# Patient Record
Sex: Male | Born: 1975 | Race: White | Hispanic: No | Marital: Married | Smoking: Former smoker
Health system: Southern US, Community
[De-identification: ages and names within clinical notes are randomized; demographics above are authoritative.]

## PROBLEM LIST (undated history)

## (undated) DIAGNOSIS — I10 Essential (primary) hypertension: Secondary | ICD-10-CM

## (undated) DIAGNOSIS — E785 Hyperlipidemia, unspecified: Secondary | ICD-10-CM

## (undated) DIAGNOSIS — R0609 Other forms of dyspnea: Secondary | ICD-10-CM

## (undated) DIAGNOSIS — R0600 Dyspnea, unspecified: Secondary | ICD-10-CM

## (undated) DIAGNOSIS — Z6836 Body mass index (BMI) 36.0-36.9, adult: Secondary | ICD-10-CM

## (undated) DIAGNOSIS — T783XXA Angioneurotic edema, initial encounter: Secondary | ICD-10-CM

## (undated) HISTORY — DX: Other forms of dyspnea: R06.09

## (undated) HISTORY — DX: Essential (primary) hypertension: I10

## (undated) HISTORY — DX: Angioneurotic edema, initial encounter: T78.3XXA

## (undated) HISTORY — DX: Dyspnea, unspecified: R06.00

## (undated) HISTORY — DX: Body mass index (BMI) 36.0-36.9, adult: Z68.36

## (undated) HISTORY — DX: Hyperlipidemia, unspecified: E78.5

---

## 2015-09-21 ENCOUNTER — Ambulatory Visit (INDEPENDENT_AMBULATORY_CARE_PROVIDER_SITE_OTHER): Payer: Managed Care, Other (non HMO) | Admitting: Allergy and Immunology

## 2015-09-21 ENCOUNTER — Encounter: Payer: Self-pay | Admitting: Allergy and Immunology

## 2015-09-21 VITALS — BP 150/90 | HR 80 | Temp 98.1°F | Resp 18 | Ht 67.13 in | Wt 235.9 lb

## 2015-09-21 DIAGNOSIS — T783XXA Angioneurotic edema, initial encounter: Secondary | ICD-10-CM | POA: Insufficient documentation

## 2015-09-21 DIAGNOSIS — T7840XA Allergy, unspecified, initial encounter: Secondary | ICD-10-CM

## 2015-09-21 HISTORY — DX: Angioneurotic edema, initial encounter: T78.3XXA

## 2015-09-21 MED ORDER — EPINEPHRINE 0.3 MG/0.3ML IJ SOAJ: 0.3000 mg | Freq: Once | INTRAMUSCULAR | Status: AC

## 2015-09-21 NOTE — Progress Notes (Signed)
[facility]    NEW PATIENT NOTE  Referring Provider: No ref. provider found Primary Provider: Wilmer Floor., MD    Subjective:   Patient ID: Matthew Navarro is a 39 y.o. male with a chief complaint of Angioedema  who presents to the clinic with the following problems:  HPI Comments:  Matthew Navarro presents this clinic in 09/21/2015 in evaluation of swelling. It sounds as though over the course of the past 7 years he has had multiple episodes of swelling involving either his face or hands. His face swelling is associated with lip swelling and periorbital swelling but does not sound as though it is associated with any tongue swelling. Usually these episodes last several hours after being treated with Benadryl. He did have to go to the urticaria care center about one month ago for significant facial swelling requiring the administration of steroids. He thinks that his swelling episodes may be related to consumption of apples and peaches. Apparently carries gives him throat itching. He does have a history of seasonal allergic rhinitis for which she'll take some over-the-counter antihistamines during the spring and fall.   History reviewed. No pertinent past medical history.  History reviewed. No pertinent past surgical history.  No outpatient encounter prescriptions on file as of 09/21/2015.   No facility-administered encounter medications on file as of 09/21/2015.    No orders of the defined types were placed in this encounter.    No Known Allergies  Review of Systems  Constitutional: Negative.   HENT: Positive for sneezing (Seasonal allergic rhinitis treated with over-the-counter antihistamine during spring and fall).   Eyes: Negative.   Respiratory: Negative.   Cardiovascular: Negative.   Gastrointestinal: Negative.   Genitourinary: Negative.   Musculoskeletal: Negative.   Skin: Negative.   Neurological: Negative.    Hematological: Negative.     Family History  Problem Relation Age of Onset  . Heart attack Father   . Diabetes Paternal Uncle   . Parkinson's disease Paternal Grandmother   . Breast cancer Paternal Grandmother   . CVA Paternal Grandfather     Social History   Social History  . Marital Status: Single    Spouse Name: N/A  . Number of Children: N/A  . Years of Education: N/A   Occupational History  . Not on file.   Social History Main Topics  . Smoking status: Former Smoker -- 0.08 packs/day for 8 years    Types: Cigarettes    Quit date: 11/01/2011  . Smokeless tobacco: Not on file  . Alcohol Use: 6.0 oz/week    10 Standard drinks or equivalent per week     Comment: wine or beer  . Drug Use: No  . Sexual Activity: Not on file   Other Topics Concern  . Not on file   Social History Narrative  . No narrative on file    Environmental and Social history  Lives in a house with a dry environment, dogs located inside the household, carpeting in the bedroom, sleeping of step mattress without any plastic for the bed or pillows, and no smokers located inside the household. He works as a Engineer, site company   Objective:   Filed Vitals:   09/21/15 1411  BP: 150/90  Pulse: 80  Temp: 98.1 F (36.7 C)  Resp: 18        Physical Exam  Constitutional: He appears well-developed and well-nourished. No distress.  HENT:  Head: Normocephalic and atraumatic. Head is without right periorbital erythema and without left periorbital erythema.  Right Ear: Tympanic membrane, external ear and ear canal normal. No drainage or tenderness. No foreign bodies. Tympanic membrane is not injected, not scarred, not perforated, not erythematous, not retracted and not bulging. No middle ear effusion.  Left Ear: Tympanic membrane, external ear and ear canal normal. No drainage or tenderness. No foreign bodies. Tympanic membrane is not injected, not scarred,  not perforated, not erythematous, not retracted and not bulging.  No middle ear effusion.  Nose: Nose normal. No mucosal edema, rhinorrhea, nose lacerations or sinus tenderness.  No foreign bodies.  Mouth/Throat: Oropharynx is clear and moist. No oropharyngeal exudate, posterior oropharyngeal edema, posterior oropharyngeal erythema or tonsillar abscesses.  Eyes: Lids are normal. Right eye exhibits no chemosis, no discharge and no exudate. No foreign body present in the right eye. Left eye exhibits no chemosis, no discharge and no exudate. No foreign body present in the left eye. Right conjunctiva is not injected. Left conjunctiva is not injected.  Neck: Neck supple. No tracheal tenderness present. No tracheal deviation and no edema present. No thyroid mass and no thyromegaly present.  Cardiovascular: Normal rate, regular rhythm, S1 normal and S2 normal.  Exam reveals no gallop.   No murmur heard. Pulmonary/Chest: No accessory muscle usage or stridor. No respiratory distress. He has no wheezes. He has no rhonchi. He has no rales.  Abdominal: Soft. He exhibits no distension and no mass. There is no tenderness. There is no rebound and no guarding.  Lymphadenopathy:       Head (right side): No tonsillar adenopathy present.       Head (left side): No tonsillar adenopathy present.    He has no cervical adenopathy.  Neurological: He is alert.  Skin: No rash noted. He is not diaphoretic.  Psychiatric: He has a normal mood and affect. His behavior is normal.    Diagnostics:  Allergy skin tests were performed.  He demonstrated hypersensitivity against almonds and hazelnut on a screening food panel   Assessment and Plan:    1. Angioedema, initial encounter   2. Allergic reaction, initial encounter      1. Avoidance measures - almonds / hazelnut  2. Epi-Pen, Benadryl, MD / ER for allergic reaction.  3. Blood - cbc with diff, fruit IC panel, alpha-gal panel, cmp, sed, U/A  4. Further  evaluation or treatment? - yes, if recurrent  5. Get a flu vaccine.  I will have the obtain the blood tests noted above in investigation of his immunologic hyperreactivity and we'll make a decision about what further evaluation and treatment he may require pending his response to therapy noted in his plan. I did give him an EpiPen to cover the possibility of a severe allergic reaction that may threaten one of his major organ systems. He will keep in contact with me noting his response to the therapy mentioned above.    Laurette SchimkeEric Kozlow, MD [facility]

## 2015-09-21 NOTE — Patient Instructions (Signed)
  1. Avoidance measures  2. Epi-Pen, Benadryl, MD / ER for allergic reaction.  3. Blood - cbc with diff, fruit IC panel, alpha-gal panel, cmp, sed, U/A  4. Further evaluation or treatment? - yes, if recurrent  5. Get a flu vaccine.

## 2015-09-26 LAB — URINALYSIS
BILIRUBIN UA: NEGATIVE
GLUCOSE, UA: NEGATIVE
KETONES UA: NEGATIVE
Leukocytes, UA: NEGATIVE
NITRITE UA: NEGATIVE
RBC, UA: NEGATIVE
Specific Gravity, UA: 1.027 (ref 1.005–1.030)
UUROB: 0.2 mg/dL (ref 0.2–1.0)
pH, UA: 6.5 (ref 5.0–7.5)

## 2015-09-26 LAB — COMPREHENSIVE METABOLIC PANEL
ALBUMIN: 4.9 g/dL (ref 3.5–5.5)
ALT: 30 IU/L (ref 0–44)
AST: 20 IU/L (ref 0–40)
Albumin/Globulin Ratio: 2 (ref 1.1–2.5)
Alkaline Phosphatase: 64 IU/L (ref 39–117)
BILIRUBIN TOTAL: 0.6 mg/dL (ref 0.0–1.2)
BUN/Creatinine Ratio: 17 (ref 8–19)
BUN: 15 mg/dL (ref 6–20)
CALCIUM: 9.7 mg/dL (ref 8.7–10.2)
CHLORIDE: 97 mmol/L (ref 97–106)
CO2: 28 mmol/L (ref 18–29)
CREATININE: 0.88 mg/dL (ref 0.76–1.27)
GFR, EST AFRICAN AMERICAN: 126 mL/min/{1.73_m2} (ref >59)
GFR, EST NON AFRICAN AMERICAN: 109 mL/min/{1.73_m2} (ref >59)
GLUCOSE: 106 mg/dL — AB (ref 65–99)
Globulin, Total: 2.4 g/dL (ref 1.5–4.5)
Potassium: 4.3 mmol/L (ref 3.5–5.2)
Sodium: 140 mmol/L (ref 136–144)
TOTAL PROTEIN: 7.3 g/dL (ref 6.0–8.5)

## 2015-09-26 LAB — ALLERGEN PROFILE, FOOD-FRUIT
ALLERGEN APPLE, IGE: 0.61 kU/L — AB
Allergen Banana IgE: 0.3 kU/L — AB
Allergen, Peach f95: 1.01 kU/L — AB
F094-IGE PEAR: 0.32 kU/L — AB
F259-IGE GRAPE: 0.16 kU/L — AB

## 2015-09-26 LAB — CBC WITH DIFFERENTIAL/PLATELET
BASOS: 1 %
Basophils Absolute: 0 10*3/uL (ref 0.0–0.2)
EOS (ABSOLUTE): 0.1 10*3/uL (ref 0.0–0.4)
Eos: 1 %
HEMOGLOBIN: 16.6 g/dL (ref 12.6–17.7)
Hematocrit: 48.5 % (ref 37.5–51.0)
IMMATURE GRANS (ABS): 0 10*3/uL (ref 0.0–0.1)
Immature Granulocytes: 0 %
LYMPHS: 29 %
Lymphocytes Absolute: 2.4 10*3/uL (ref 0.7–3.1)
MCH: 30.2 pg (ref 26.6–33.0)
MCHC: 34.2 g/dL (ref 31.5–35.7)
MCV: 88 fL (ref 79–97)
MONOCYTES: 9 %
Monocytes Absolute: 0.8 10*3/uL (ref 0.1–0.9)
NEUTROS ABS: 4.9 10*3/uL (ref 1.4–7.0)
Neutrophils: 60 %
Platelets: 181 10*3/uL (ref 150–379)
RBC: 5.5 x10E6/uL (ref 4.14–5.80)
RDW: 13.5 % (ref 12.3–15.4)
WBC: 8.2 10*3/uL (ref 3.4–10.8)

## 2015-09-26 LAB — ALPHA-GAL PANEL
Class Interpretation: 0
Class Interpretation: 0
Class Interpretation: 0
Lamb/Mutton (Ovis spp) IgE: <0.1 kU/L (ref <0.35)
Pork (Sus spp) IgE: <0.1 kU/L (ref <0.35)

## 2015-09-26 LAB — SEDIMENTATION RATE: Sed Rate: 2 mm/hr (ref 0–15)

## 2016-01-05 ENCOUNTER — Telehealth: Payer: Self-pay | Admitting: Allergy

## 2016-01-05 NOTE — Telephone Encounter (Signed)
Testing went to ded - explained that to pt - he will pay $50/mo

## 2016-01-05 NOTE — Telephone Encounter (Signed)
PATIENT CALLED AND NEEDSTO DISCUSS BILL REQUARDING TEST AS WHAT HIS INSURANCE PAID.MRN # [id number]. PHONE #  [phone].

## 2016-06-15 DIAGNOSIS — H3530 Unspecified macular degeneration: Secondary | ICD-10-CM | POA: Diagnosis not present

## 2016-10-07 DIAGNOSIS — I10 Essential (primary) hypertension: Secondary | ICD-10-CM | POA: Diagnosis not present

## 2016-10-07 DIAGNOSIS — Z6836 Body mass index (BMI) 36.0-36.9, adult: Secondary | ICD-10-CM | POA: Diagnosis not present

## 2016-10-07 DIAGNOSIS — Z79899 Other long term (current) drug therapy: Secondary | ICD-10-CM | POA: Diagnosis not present

## 2016-10-07 DIAGNOSIS — E785 Hyperlipidemia, unspecified: Secondary | ICD-10-CM | POA: Diagnosis not present

## 2016-10-27 DIAGNOSIS — B360 Pityriasis versicolor: Secondary | ICD-10-CM | POA: Diagnosis not present

## 2016-12-16 DIAGNOSIS — H3530 Unspecified macular degeneration: Secondary | ICD-10-CM | POA: Diagnosis not present

## 2017-01-06 DIAGNOSIS — I10 Essential (primary) hypertension: Secondary | ICD-10-CM | POA: Diagnosis not present

## 2017-01-06 DIAGNOSIS — Z6836 Body mass index (BMI) 36.0-36.9, adult: Secondary | ICD-10-CM | POA: Diagnosis not present

## 2017-01-06 DIAGNOSIS — E785 Hyperlipidemia, unspecified: Secondary | ICD-10-CM | POA: Diagnosis not present

## 2017-01-06 DIAGNOSIS — Z79899 Other long term (current) drug therapy: Secondary | ICD-10-CM | POA: Diagnosis not present

## 2017-02-09 DIAGNOSIS — Z6837 Body mass index (BMI) 37.0-37.9, adult: Secondary | ICD-10-CM | POA: Diagnosis not present

## 2017-02-09 DIAGNOSIS — E669 Obesity, unspecified: Secondary | ICD-10-CM | POA: Diagnosis not present

## 2017-02-09 DIAGNOSIS — S0500XA Injury of conjunctiva and corneal abrasion without foreign body, unspecified eye, initial encounter: Secondary | ICD-10-CM | POA: Diagnosis not present

## 2017-04-13 DIAGNOSIS — E785 Hyperlipidemia, unspecified: Secondary | ICD-10-CM | POA: Diagnosis not present

## 2017-04-13 DIAGNOSIS — I10 Essential (primary) hypertension: Secondary | ICD-10-CM | POA: Diagnosis not present

## 2017-04-13 DIAGNOSIS — Z79899 Other long term (current) drug therapy: Secondary | ICD-10-CM | POA: Diagnosis not present

## 2017-04-13 DIAGNOSIS — Z1389 Encounter for screening for other disorder: Secondary | ICD-10-CM | POA: Diagnosis not present

## 2017-07-13 DIAGNOSIS — Z79899 Other long term (current) drug therapy: Secondary | ICD-10-CM | POA: Diagnosis not present

## 2017-07-13 DIAGNOSIS — E785 Hyperlipidemia, unspecified: Secondary | ICD-10-CM | POA: Diagnosis not present

## 2017-07-23 DIAGNOSIS — L0390 Cellulitis, unspecified: Secondary | ICD-10-CM | POA: Diagnosis not present

## 2017-10-12 DIAGNOSIS — Z6837 Body mass index (BMI) 37.0-37.9, adult: Secondary | ICD-10-CM | POA: Diagnosis not present

## 2017-10-12 DIAGNOSIS — Z0000 Encounter for general adult medical examination without abnormal findings: Secondary | ICD-10-CM | POA: Diagnosis not present

## 2017-10-12 DIAGNOSIS — E669 Obesity, unspecified: Secondary | ICD-10-CM | POA: Diagnosis not present

## 2017-10-12 DIAGNOSIS — I10 Essential (primary) hypertension: Secondary | ICD-10-CM | POA: Diagnosis not present

## 2018-01-11 DIAGNOSIS — I10 Essential (primary) hypertension: Secondary | ICD-10-CM | POA: Diagnosis not present

## 2018-01-11 DIAGNOSIS — Z79899 Other long term (current) drug therapy: Secondary | ICD-10-CM | POA: Diagnosis not present

## 2018-01-11 DIAGNOSIS — Z6837 Body mass index (BMI) 37.0-37.9, adult: Secondary | ICD-10-CM | POA: Diagnosis not present

## 2018-01-11 DIAGNOSIS — E785 Hyperlipidemia, unspecified: Secondary | ICD-10-CM | POA: Diagnosis not present

## 2018-01-30 DIAGNOSIS — H3530 Unspecified macular degeneration: Secondary | ICD-10-CM | POA: Diagnosis not present

## 2018-07-18 DIAGNOSIS — I10 Essential (primary) hypertension: Secondary | ICD-10-CM | POA: Diagnosis not present

## 2018-07-18 DIAGNOSIS — E785 Hyperlipidemia, unspecified: Secondary | ICD-10-CM | POA: Diagnosis not present

## 2018-07-18 DIAGNOSIS — Z79899 Other long term (current) drug therapy: Secondary | ICD-10-CM | POA: Diagnosis not present

## 2018-07-18 DIAGNOSIS — Z1331 Encounter for screening for depression: Secondary | ICD-10-CM | POA: Diagnosis not present

## 2018-07-18 DIAGNOSIS — Z6836 Body mass index (BMI) 36.0-36.9, adult: Secondary | ICD-10-CM | POA: Diagnosis not present

## 2018-09-13 DIAGNOSIS — Z6838 Body mass index (BMI) 38.0-38.9, adult: Secondary | ICD-10-CM | POA: Diagnosis not present

## 2018-09-13 DIAGNOSIS — J90 Pleural effusion, not elsewhere classified: Secondary | ICD-10-CM | POA: Diagnosis not present

## 2018-09-13 DIAGNOSIS — M79602 Pain in left arm: Secondary | ICD-10-CM | POA: Diagnosis not present

## 2018-11-01 DIAGNOSIS — E785 Hyperlipidemia, unspecified: Secondary | ICD-10-CM | POA: Diagnosis not present

## 2018-11-01 DIAGNOSIS — Z0000 Encounter for general adult medical examination without abnormal findings: Secondary | ICD-10-CM | POA: Diagnosis not present

## 2018-11-01 DIAGNOSIS — Z6837 Body mass index (BMI) 37.0-37.9, adult: Secondary | ICD-10-CM | POA: Diagnosis not present

## 2018-11-01 DIAGNOSIS — I10 Essential (primary) hypertension: Secondary | ICD-10-CM | POA: Diagnosis not present

## 2018-12-11 DIAGNOSIS — B360 Pityriasis versicolor: Secondary | ICD-10-CM | POA: Diagnosis not present

## 2019-05-02 DIAGNOSIS — Z79899 Other long term (current) drug therapy: Secondary | ICD-10-CM | POA: Diagnosis not present

## 2019-05-02 DIAGNOSIS — Z6837 Body mass index (BMI) 37.0-37.9, adult: Secondary | ICD-10-CM | POA: Diagnosis not present

## 2019-05-02 DIAGNOSIS — E785 Hyperlipidemia, unspecified: Secondary | ICD-10-CM | POA: Diagnosis not present

## 2019-05-02 DIAGNOSIS — I10 Essential (primary) hypertension: Secondary | ICD-10-CM | POA: Diagnosis not present

## 2019-11-13 DIAGNOSIS — I10 Essential (primary) hypertension: Secondary | ICD-10-CM | POA: Diagnosis not present

## 2019-11-13 DIAGNOSIS — E785 Hyperlipidemia, unspecified: Secondary | ICD-10-CM | POA: Diagnosis not present

## 2019-11-13 DIAGNOSIS — Z6838 Body mass index (BMI) 38.0-38.9, adult: Secondary | ICD-10-CM | POA: Diagnosis not present

## 2019-11-13 DIAGNOSIS — Z0000 Encounter for general adult medical examination without abnormal findings: Secondary | ICD-10-CM | POA: Diagnosis not present

## 2019-11-13 DIAGNOSIS — Z13220 Encounter for screening for lipoid disorders: Secondary | ICD-10-CM | POA: Diagnosis not present

## 2020-05-12 DIAGNOSIS — E785 Hyperlipidemia, unspecified: Secondary | ICD-10-CM | POA: Diagnosis not present

## 2020-05-12 DIAGNOSIS — M778 Other enthesopathies, not elsewhere classified: Secondary | ICD-10-CM | POA: Diagnosis not present

## 2020-05-12 DIAGNOSIS — Z79899 Other long term (current) drug therapy: Secondary | ICD-10-CM | POA: Diagnosis not present

## 2020-05-12 DIAGNOSIS — I10 Essential (primary) hypertension: Secondary | ICD-10-CM | POA: Diagnosis not present

## 2020-08-19 DIAGNOSIS — T783XXA Angioneurotic edema, initial encounter: Secondary | ICD-10-CM | POA: Diagnosis not present

## 2020-09-18 DIAGNOSIS — M25471 Effusion, right ankle: Secondary | ICD-10-CM | POA: Diagnosis not present

## 2020-09-18 DIAGNOSIS — Z6836 Body mass index (BMI) 36.0-36.9, adult: Secondary | ICD-10-CM | POA: Diagnosis not present

## 2020-09-18 DIAGNOSIS — R053 Chronic cough: Secondary | ICD-10-CM | POA: Diagnosis not present

## 2020-09-18 DIAGNOSIS — M25472 Effusion, left ankle: Secondary | ICD-10-CM | POA: Diagnosis not present

## 2020-10-16 DIAGNOSIS — R609 Edema, unspecified: Secondary | ICD-10-CM | POA: Diagnosis not present

## 2020-10-16 DIAGNOSIS — E785 Hyperlipidemia, unspecified: Secondary | ICD-10-CM | POA: Diagnosis not present

## 2020-10-16 DIAGNOSIS — I10 Essential (primary) hypertension: Secondary | ICD-10-CM | POA: Diagnosis not present

## 2020-10-16 DIAGNOSIS — Z79899 Other long term (current) drug therapy: Secondary | ICD-10-CM | POA: Diagnosis not present

## 2020-11-24 DIAGNOSIS — Z0000 Encounter for general adult medical examination without abnormal findings: Secondary | ICD-10-CM | POA: Diagnosis not present

## 2020-11-24 DIAGNOSIS — Z6838 Body mass index (BMI) 38.0-38.9, adult: Secondary | ICD-10-CM | POA: Diagnosis not present

## 2020-11-24 DIAGNOSIS — Z1331 Encounter for screening for depression: Secondary | ICD-10-CM | POA: Diagnosis not present

## 2020-11-27 ENCOUNTER — Encounter (INDEPENDENT_AMBULATORY_CARE_PROVIDER_SITE_OTHER): Payer: Self-pay

## 2020-11-27 ENCOUNTER — Other Ambulatory Visit: Payer: Self-pay

## 2020-11-27 ENCOUNTER — Ambulatory Visit: Payer: BC Managed Care – PPO | Admitting: Cardiology

## 2020-11-27 VITALS — BP 156/102 | HR 73 | Ht 70.0 in | Wt 257.0 lb

## 2020-11-27 DIAGNOSIS — E7800 Pure hypercholesterolemia, unspecified: Secondary | ICD-10-CM | POA: Diagnosis not present

## 2020-11-27 DIAGNOSIS — R0600 Dyspnea, unspecified: Secondary | ICD-10-CM | POA: Diagnosis not present

## 2020-11-27 DIAGNOSIS — I10 Essential (primary) hypertension: Secondary | ICD-10-CM

## 2020-11-27 DIAGNOSIS — R0609 Other forms of dyspnea: Secondary | ICD-10-CM

## 2020-11-27 DIAGNOSIS — R072 Precordial pain: Secondary | ICD-10-CM | POA: Diagnosis not present

## 2020-11-27 MED ORDER — METOPROLOL TARTRATE 100 MG PO TABS: 100.0000 mg | ORAL_TABLET | Freq: Once | ORAL | 0 refills | Status: DC

## 2020-11-27 NOTE — Progress Notes (Signed)
Cardiology Office Note:    Date:  11/27/2020   ID:  Matthew Navarro, DOB 1976/03/06, MRN [id number]  PCP:  Janine Limbo, PA-C  [place] Cardiologist:  No primary care provider on file.  [facility] Electrophysiologist:  None   Referring MD: Janine Limbo, PA-C    History of Present Illness:    Matthew Navarro is a 45 y.o. male with a hx of HTN, HLD, and angioedema who was referred by Quentin Angst, PA-C for further management of dyspnea on exertion.   Patient states that back in September, he was walking in [place] on many inclines and he felt very short of breath with some wheezing and chest pressure at the time. He stopped and rested and his symptoms abated. Had a few other minor episodes while working out, but nothing severe until a couple of weeks ago when he was out clearing the ice and he felt like he couldn't take a full deep breath. Again, he had some chest pressure at that time. He sat down and symptoms resolved. No known history of asthma or exercise induced asthma. No known personal history of CAD but has a strong family history as detailed below. Symptoms only occurred with exertion and do not occur with rest.   Prior smoker x 10 years. Quit several years ago (used to smoke 1-2packs per week)  Family history: Paternal GF: CVA, Paternal GM: CAD, Dad with MI (age 16; smoker and drinker).   Past Medical History:  Diagnosis Date  . Angioedema 09/21/2015  . BMI 36.0-36.9,adult   . DOE (dyspnea on exertion)    after a fall  . HTN (hypertension)   . Hyperlipidemia     History reviewed. No pertinent surgical history.  Current Medications: Current Meds  Medication Sig  . atorvastatin (LIPITOR) 20 MG tablet Take 20 mg by mouth daily.  Marland Kitchen diltiazem (CARDIZEM CD) 180 MG 24 hr capsule Take 180 mg by mouth daily.  Marland Kitchen EPINEPHrine 0.3 mg/0.3 mL IJ SOAJ injection Inject 0.3 mLs (0.3 mg total) into the muscle once.  . hydrochlorothiazide (HYDRODIURIL) 12.5 MG tablet Take  12.5 mg by mouth daily.  . metoprolol tartrate (LOPRESSOR) 100 MG tablet Take 1 tablet (100 mg total) by mouth once for 1 dose. Take 2 hours prior to your Coronary CT.  . Multiple Vitamin (MULTIVITAMIN) capsule Take 1 capsule by mouth daily.     Allergies:   Amlodipine and Amlodipine-olmesartan   Social History   Socioeconomic History  . Marital status: Single    Spouse name: Not on file  . Number of children: Not on file  . Years of education: Not on file  . Highest education level: Not on file  Occupational History  . Not on file  Tobacco Use  . Smoking status: Former Smoker    Packs/day: 0.08    Years: 8.00    Pack years: 0.64    Types: Cigarettes    Quit date: 11/01/2011    Years since quitting: 9.0  . Smokeless tobacco: Never Used  Substance and Sexual Activity  . Alcohol use: Yes    Alcohol/week: 10.0 standard drinks    Types: 10 Standard drinks or equivalent per week    Comment: wine or beer  . Drug use: No  . Sexual activity: Not on file  Other Topics Concern  . Not on file  Social History Narrative  . Not on file   Social Determinants of Health   Financial Resource Strain: Not on file  Food Insecurity: Not  on file  Transportation Needs: Not on file  Physical Activity: Not on file  Stress: Not on file  Social Connections: Not on file     Family History: The patient's family history includes Breast cancer in his paternal grandmother; CVA in his paternal grandfather; Diabetes in his paternal uncle; Heart attack (age of onset: 58) in his father; Parkinson's disease in his paternal grandmother.  ROS:   Please see the history of present illness.    Review of Systems  Constitutional: Negative for chills and fever.  HENT: Negative for nosebleeds.   Eyes: Negative for blurred vision.  Respiratory: Positive for shortness of breath.   Cardiovascular: Positive for chest pain. Negative for palpitations, orthopnea, claudication, leg swelling and PND.   Gastrointestinal: Negative for nausea and vomiting.  Genitourinary: Negative for hematuria.  Musculoskeletal: Negative for falls.  Neurological: Negative for dizziness and loss of consciousness.  Endo/Heme/Allergies: Negative for polydipsia.  Psychiatric/Behavioral: Negative for substance abuse.    EKGs/Labs/Other Studies Reviewed:    The following studies were reviewed today: No cardiac studies  EKG:  EKG is  ordered today.  The ekg ordered today demonstrates NSR with HR 73  Recent Labs: No results found for requested labs within last 8760 hours.  Recent Lipid Panel No results found for: CHOL, TRIG, HDL, CHOLHDL, VLDL, LDLCALC, LDLDIRECT   Physical Exam:    VS:  BP (!) 156/102   Pulse 73   Ht _0  (1.778 m)   Wt 257 lb (116.6 kg)   SpO2 96%   BMI 36.88 kg/m     Wt Readings from Last 3 Encounters:  11/27/20 257 lb (116.6 kg)  09/21/15 235 lb 14.3 oz (107 kg)     GEN:  Well nourished, well developed in no acute distress HEENT: Normal NECK: No JVD; No carotid bruits CARDIAC: RRR, no murmurs, rubs, gallops RESPIRATORY:  Clear to auscultation without rales, wheezing or rhonchi  ABDOMEN: Soft, non-tender, non-distended MUSCULOSKELETAL:  No edema; No deformity  SKIN: Warm and dry NEUROLOGIC:  Alert and oriented x 3 PSYCHIATRIC:  Normal affect   ASSESSMENT:    1. Dyspnea on exertion   2. Precordial pain   3. Primary hypertension   4. Pure hypercholesterolemia    PLAN:    In order of problems listed above:  #Dyspnea on Exertion: #Chest Pain: Patient with episodes of exertional dyspnea and associated chest pressure. Specifically, he feels that during times of significant exertion, he gets suddenly very short of breath, develops chest pressure and feels like he cannot get enough air in. Unclear etiology, however, patient has risk factors including HTN, HLD, and positive family history of CAD. Will check coronary CTA and echo and if negative, recommend referral to  Pulm for further work-up. -Check coronary CTA -Check TTE  #HTN: Well controlled. Managed by PCP. -Continue dilt and HCTZ -Has angioedema with ACE/ARBs  #HLD: Managed by PCP. -Continue atorvastatin 84m daily    Medication Adjustments/Labs and Tests Ordered: Current medicines are reviewed at length with the patient today.  Concerns regarding medicines are outlined above.  Orders Placed This Encounter  Procedures  . CT CORONARY MORPH W/CTA COR W/SCORE W/CA W/CM &/OR WO/CM  . CT CORONARY FRACTIONAL FLOW RESERVE DATA PREP  . CT CORONARY FRACTIONAL FLOW RESERVE FLUID ANALYSIS  . Basic metabolic panel  . EKG 12-Lead  . ECHOCARDIOGRAM COMPLETE   Meds ordered this encounter  Medications  . metoprolol tartrate (LOPRESSOR) 100 MG tablet    Sig: Take 1 tablet (100 mg  total) by mouth once for 1 dose. Take 2 hours prior to your Coronary CT.    Dispense:  1 tablet    Refill:  0    Patient Instructions  Medication Instructions:   Your physician recommends that you continue on your current medications as directed. Please refer to the Current Medication list given to you today.  *If you need a refill on your cardiac medications before your next appointment, please call your pharmacy   Testing/Procedures:  Your physician has requested that you have an echocardiogram. Echocardiography is a painless test that uses sound waves to create images of your heart. It provides your doctor with information about the size and shape of your heart and how well your heart's chambers and valves are working. This procedure takes approximately one hour. There are no restrictions for this procedure.   CARDIAC CT Your cardiac CT will be scheduled at one of the below locations:   [facility] [address] [place], [place] [address] [phone]  If scheduled at [facility], please arrive at the [facility] main entrance of [facility] 30 minutes prior to test start  time. Proceed to the [facility] Radiology Department (first floor) to check-in and test prep.  Please follow these instructions carefully (unless otherwise directed):  Hold all erectile dysfunction medications at least 3 days (72 hrs) prior to test.  On the Night Before the Test: . Be sure to Drink plenty of water. . Do not consume any caffeinated/decaffeinated beverages or chocolate 12 hours prior to your test. . Do not take any antihistamines 12 hours prior to your test.  On the Day of the Test: . Drink plenty of water. Do not drink any water within one hour of the test. . Do not eat any food 4 hours prior to the test. . You may take your regular medications prior to the test.  . Take metoprolol 100 MG BY MOUTH (Lopressor) two hours prior to test. . HOLD Hydrochlorothiazide morning of the test.  After the Test: . Drink plenty of water. . After receiving IV contrast, you may experience a mild flushed feeling. This is normal. . On occasion, you may experience a mild rash up to 24 hours after the test. This is not dangerous. If this occurs, you can take Benadryl 25 mg and increase your fluid intake. . If you experience trouble breathing, this can be serious. If it is severe call 911 IMMEDIATELY. If it is mild, please call our office.  Once we have confirmed authorization from your insurance company, we will call you to set up a date and time for your test. Based on how quickly your insurance processes prior authorizations requests, please allow up to 4 weeks to be contacted for scheduling your Cardiac CT appointment. Be advised that routine Cardiac CT appointments could be scheduled as many as 8 weeks after your provider has ordered it.  For non-scheduling related questions, please contact the cardiac imaging nurse navigator should you have any questions/concerns: Marchia Bond, Cardiac Imaging Nurse Navigator Burley Saver, Interim Cardiac Imaging Nurse [place] and  Vascular Services Direct Office Dial: [phone]   For scheduling needs, including cancellations and rescheduling, please call [place], [phone].     Follow-Up:  3 MONTHS IN THE OFFICE WITH DR. Johney Frame     Signed, Freada Bergeron, MD  11/27/2020 5:06 PM    [place]

## 2020-11-27 NOTE — Patient Instructions (Signed)
Medication Instructions:   Your physician recommends that you continue on your current medications as directed. Please refer to the Current Medication list given to you today.  *If you need a refill on your cardiac medications before your next appointment, please call your pharmacy   Testing/Procedures:  Your physician has requested that you have an echocardiogram. Echocardiography is a painless test that uses sound waves to create images of your heart. It provides your doctor with information about the size and shape of your heart and how well your heart's chambers and valves are working. This procedure takes approximately one hour. There are no restrictions for this procedure.   CARDIAC CT Your cardiac CT will be scheduled at one of the below locations:   [facility] [address] [place], [place] [address] [phone]  If scheduled at [facility], please arrive at the [facility] main entrance of [facility] 30 minutes prior to test start time. Proceed to the [facility] Radiology Department (first floor) to check-in and test prep.  Please follow these instructions carefully (unless otherwise directed):  Hold all erectile dysfunction medications at least 3 days (72 hrs) prior to test.  On the Night Before the Test: . Be sure to Drink plenty of water. . Do not consume any caffeinated/decaffeinated beverages or chocolate 12 hours prior to your test. . Do not take any antihistamines 12 hours prior to your test.  On the Day of the Test: . Drink plenty of water. Do not drink any water within one hour of the test. . Do not eat any food 4 hours prior to the test. . You may take your regular medications prior to the test.  . Take metoprolol 100 MG BY MOUTH (Lopressor) two hours prior to test. . HOLD Hydrochlorothiazide morning of the test.  After the Test: . Drink plenty of water. . After receiving IV contrast, you may experience a mild flushed  feeling. This is normal. . On occasion, you may experience a mild rash up to 24 hours after the test. This is not dangerous. If this occurs, you can take Benadryl 25 mg and increase your fluid intake. . If you experience trouble breathing, this can be serious. If it is severe call 911 IMMEDIATELY. If it is mild, please call our office.  Once we have confirmed authorization from your insurance company, we will call you to set up a date and time for your test. Based on how quickly your insurance processes prior authorizations requests, please allow up to 4 weeks to be contacted for scheduling your Cardiac CT appointment. Be advised that routine Cardiac CT appointments could be scheduled as many as 8 weeks after your provider has ordered it.  For non-scheduling related questions, please contact the cardiac imaging nurse navigator should you have any questions/concerns: Marchia Bond, Cardiac Imaging Nurse Navigator Burley Saver, Interim Cardiac Imaging Nurse [place] and Vascular Services Direct Office Dial: [phone]   For scheduling needs, including cancellations and rescheduling, please call [place], [phone].     Follow-Up:  3 MONTHS IN THE OFFICE WITH DR. Johney Frame

## 2020-12-10 ENCOUNTER — Emergency Department (HOSPITAL_COMMUNITY)
Admission: EM | Admit: 2020-12-10 | Discharge: 2020-12-10 | Disposition: A | Discharge status: Home / Self Care | Payer: BC Managed Care – PPO | Attending: Emergency Medicine | Admitting: Emergency Medicine

## 2020-12-10 ENCOUNTER — Encounter (HOSPITAL_COMMUNITY): Payer: Self-pay

## 2020-12-10 ENCOUNTER — Other Ambulatory Visit: Payer: Self-pay

## 2020-12-10 DIAGNOSIS — R220 Localized swelling, mass and lump, head: Secondary | ICD-10-CM | POA: Diagnosis not present

## 2020-12-10 DIAGNOSIS — T783XXA Angioneurotic edema, initial encounter: Secondary | ICD-10-CM

## 2020-12-10 DIAGNOSIS — Z79899 Other long term (current) drug therapy: Secondary | ICD-10-CM | POA: Insufficient documentation

## 2020-12-10 DIAGNOSIS — Z87891 Personal history of nicotine dependence: Secondary | ICD-10-CM | POA: Insufficient documentation

## 2020-12-10 DIAGNOSIS — I10 Essential (primary) hypertension: Secondary | ICD-10-CM | POA: Diagnosis not present

## 2020-12-10 MED ORDER — DIPHENHYDRAMINE HCL 50 MG/ML IJ SOLN
25.0000 mg | Freq: Once | INTRAMUSCULAR | Status: AC
  Administered 2020-12-10: 25 mg via INTRAVENOUS
  Filled 2020-12-10: qty 1

## 2020-12-10 MED ORDER — FAMOTIDINE IN NACL 20-0.9 MG/50ML-% IV SOLN
20.0000 mg | Freq: Once | INTRAVENOUS | Status: AC
  Administered 2020-12-10: 20 mg via INTRAVENOUS
  Filled 2020-12-10: qty 50

## 2020-12-10 MED ORDER — DEXAMETHASONE SODIUM PHOSPHATE 10 MG/ML IJ SOLN
10.0000 mg | Freq: Once | INTRAMUSCULAR | Status: AC
  Administered 2020-12-10: 10 mg via INTRAVENOUS
  Filled 2020-12-10: qty 1

## 2020-12-10 NOTE — ED Provider Notes (Signed)
[facility]EMER[facility]Y DEPT Provider Note: Lowella Dell, MD, FACEP  CSN: [id number] MRN: [id number] ARRIVAL: 12/10/20 at 0001 ROOM: WA15/WA15   CHIEF COMPLAINT  Oral Swelling   HISTORY OF PRESENT ILLNESS  12/10/20 2:07 AM Matthew Navarro is a 45 y.o. male with a history of angioedema.  He is not on an ACE inhibitor or angiotensin receptor blocker.  He is here with swelling of his right upper and lower lips that began about 2 PM yesterday afternoon.  The swelling is mild to moderate.  He took 50 mg of Benadryl about 4 hours ago with some improvement.  He has no swelling of the tongue or throat.  He has felt his mouth to be dry but thinks this may be due to the Benadryl.  He is having no difficulty breathing, speaking or swallowing.  He is not having itching.  He recently started topical minoxidil for hair loss.   Past Medical History:  Diagnosis Date  . Angioedema 09/21/2015  . BMI 36.0-36.9,adult   . DOE (dyspnea on exertion)    after a fall  . HTN (hypertension)   . Hyperlipidemia     History reviewed. No pertinent surgical history.  Family History  Problem Relation Age of Onset  . Heart attack Father 57  . Diabetes Paternal Uncle   . Parkinson's disease Paternal Grandmother   . Breast cancer Paternal Grandmother   . CVA Paternal Grandfather     Social History   Tobacco Use  . Smoking status: Former Smoker    Packs/day: 0.08    Years: 8.00    Pack years: 0.64    Types: Cigarettes    Quit date: 11/01/2011    Years since quitting: 9.1  . Smokeless tobacco: Never Used  Substance Use Topics  . Alcohol use: Yes    Alcohol/week: 10.0 standard drinks    Types: 10 Standard drinks or equivalent per week    Comment: wine or beer  . Drug use: No    Prior to Admission medications   Medication Sig Start Date End Date Taking? Authorizing Provider  atorvastatin (LIPITOR) 20 MG tablet Take 20 mg by mouth daily.    [provider]  diltiazem (CARDIZEM CD) 180 MG 24 hr  capsule Take 180 mg by mouth daily.    [provider]  EPINEPHrine 0.3 mg/0.3 mL IJ SOAJ injection Inject 0.3 mLs (0.3 mg total) into the muscle once. 09/21/15   Kozlow, Alvira Philips, MD  hydrochlorothiazide (HYDRODIURIL) 12.5 MG tablet Take 12.5 mg by mouth daily.    [provider]  metoprolol tartrate (LOPRESSOR) 100 MG tablet Take 1 tablet (100 mg total) by mouth once for 1 dose. Take 2 hours prior to your Coronary CT. 11/27/20 11/27/20  Meriam Sprague, MD  Multiple Vitamin (MULTIVITAMIN) capsule Take 1 capsule by mouth daily.    [provider]    Allergies Amlodipine and Amlodipine-olmesartan   REVIEW OF SYSTEMS  Negative except as noted here or in the History of Present Illness.   PHYSICAL EXAMINATION  Initial Vital Signs Blood pressure (!) 216/133, pulse 86, temperature 98.8 F (37.1 C), temperature source Oral, resp. rate 19, SpO2 98 %.  Examination General: Well-developed, well-nourished male in no acute distress; appearance consistent with age of record HENT: normocephalic; atraumatic; mild angioedema of right lips without edema of tongue or pharynx:    Eyes: pupils equal, round and reactive to light; extraocular muscles intact Neck: supple Heart: regular rate and rhythm Lungs: clear to auscultation bilaterally Abdomen:  soft; nondistended; nontender; bowel sounds present Extremities: No deformity; full range of motion Neurologic: Awake, alert and oriented; motor function intact in all extremities and symmetric; no facial droop Skin: Warm and dry Psychiatric: Normal mood and affect   RESULTS  Summary of this visit's results, reviewed and interpreted by myself:   EKG Interpretation  Date/Time:    Ventricular Rate:    PR Interval:    QRS Duration:   QT Interval:    QTC Calculation:   R Axis:     Text Interpretation:        Laboratory Studies: No results found for this or any previous visit (from the past 24 hour(s)). Imaging  Studies: No results found.  ED COURSE and MDM  Nursing notes, initial and subsequent vitals signs, including pulse oximetry, reviewed and interpreted by myself.  Vitals:   12/10/20 0230 12/10/20 0245 12/10/20 0300 12/10/20 0322  BP:    (!) 163/102  Pulse: 84 83 77 76  Resp: 18 17 (!) 21 17  Temp:      TempSrc:      SpO2: 98% 97% 97% 97%   Medications  diphenhydrAMINE (BENADRYL) injection 25 mg (25 mg Intravenous Given 12/10/20 0234)  dexamethasone (DECADRON) injection 10 mg (10 mg Intravenous Given 12/10/20 0234)  famotidine (PEPCID) IVPB 20 mg premix (0 mg Intravenous Stopped 12/10/20 0308)   3:38 AM Patient has had no progression of edema while in the ED.  He continues to have no edema of the tongue or pharynx.  I believe he is safe for discharge home.  He was advised to take Benadryl 50 mg every 6 hours until symptoms resolve.  If he develops any swelling of the tongue or throat he should call 911.  He was advised to discontinue the minoxidil as this may have been a trigger.  PROCEDURES  Procedures   ED DIAGNOSES     ICD-10-CM   1. Angioedema, initial encounter  T78.Vernia Buff, MD 12/10/20 [phone]

## 2020-12-10 NOTE — ED Triage Notes (Signed)
Patient stating today around 2pm he began having right sided lip swelling. States he started a new medication last week. No difficulty breathing or speaking in full sentences. States he took benadryl today.

## 2020-12-21 ENCOUNTER — Ambulatory Visit (HOSPITAL_COMMUNITY): Payer: BC Managed Care – PPO | Attending: Cardiovascular Disease

## 2020-12-21 ENCOUNTER — Other Ambulatory Visit: Payer: Self-pay

## 2020-12-21 DIAGNOSIS — R072 Precordial pain: Secondary | ICD-10-CM | POA: Insufficient documentation

## 2020-12-21 LAB — ECHOCARDIOGRAM COMPLETE
Area-P 1/2: 3.99 cm2
S' Lateral: 3.4 cm

## 2020-12-25 ENCOUNTER — Telehealth (HOSPITAL_COMMUNITY): Payer: Self-pay | Admitting: Emergency Medicine

## 2020-12-25 NOTE — Telephone Encounter (Signed)
Reaching out to patient to offer assistance regarding upcoming cardiac imaging study; pt verbalizes understanding of appt date/time, parking situation and where to check in, pre-test NPO status and medications ordered, and verified current allergies; name and call back number provided for further questions should they arise Martino Tompson RN Navigator Cardiac Imaging [facility] [phone] office [phone] cell 

## 2020-12-25 NOTE — Telephone Encounter (Signed)
Attempted to call patient regarding upcoming cardiac CT appointment. °Left message on voicemail with name and callback number °Vici Novick RN Navigator Cardiac Imaging °[facility] °[phone] Office °[phone] Cell ° °

## 2020-12-28 ENCOUNTER — Other Ambulatory Visit: Payer: Self-pay

## 2020-12-28 ENCOUNTER — Ambulatory Visit (HOSPITAL_COMMUNITY)
Admission: RE | Admit: 2020-12-28 | Discharge: 2020-12-28 | Disposition: A | Discharge status: Home / Self Care | Payer: BC Managed Care – PPO | Source: Ambulatory Visit | Attending: Cardiology | Admitting: Cardiology

## 2020-12-28 DIAGNOSIS — Z006 Encounter for examination for normal comparison and control in clinical research program: Secondary | ICD-10-CM

## 2020-12-28 DIAGNOSIS — R072 Precordial pain: Secondary | ICD-10-CM | POA: Insufficient documentation

## 2020-12-28 MED ORDER — NITROGLYCERIN 0.4 MG SL SUBL
0.8000 mg | SUBLINGUAL_TABLET | Freq: Once | SUBLINGUAL | Status: AC
  Administered 2020-12-28: 0.8 mg via SUBLINGUAL

## 2020-12-28 MED ORDER — IOHEXOL 350 MG/ML SOLN
80.0000 mL | Freq: Once | INTRAVENOUS | Status: AC | PRN
  Administered 2020-12-28: 80 mL via INTRAVENOUS

## 2020-12-28 MED ORDER — NITROGLYCERIN 0.4 MG SL SUBL
SUBLINGUAL_TABLET | SUBLINGUAL | Status: AC
  Filled 2020-12-28: qty 2

## 2020-12-28 NOTE — Research (Signed)
IDENTIFY Informed Consent                  Subject Name: Matthew Navarro   Subject met inclusion and exclusion criteria.  The informed consent form, study requirements and expectations were reviewed with the subject and questions and concerns were addressed prior to the signing of the consent form.  The subject verbalized understanding of the trial requirements.  The subject agreed to participate in the IDENTIFY trial and signed the informed consent at 15:27PM on 12/28/20.  The informed consent was obtained prior to performance of any protocol-specific procedures for the subject.  A copy of the signed informed consent was given to the subject and a copy was placed in the subject's medical record.   Meade Maw, Naval architect

## 2020-12-30 ENCOUNTER — Telehealth: Payer: Self-pay

## 2020-12-30 DIAGNOSIS — E7800 Pure hypercholesterolemia, unspecified: Secondary | ICD-10-CM

## 2020-12-30 NOTE — Telephone Encounter (Signed)
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 This means his shortness of breath is not due to blockages in his arteries which is great news. To prevent the disease from getting worse, he needs to continue his statin medication. Can we repeat his cholesterol panel to ensure his LDL<70?

## 2020-12-30 NOTE — Telephone Encounter (Signed)
Patient called to review results of CTA. Patient verbalized understanding. Return lab appt scheduled for Tuesday 3/8 to recheck lipids.

## 2021-01-05 ENCOUNTER — Other Ambulatory Visit: Payer: BC Managed Care – PPO

## 2021-01-22 ENCOUNTER — Other Ambulatory Visit: Payer: Self-pay | Admitting: Physician Assistant

## 2021-01-22 DIAGNOSIS — K219 Gastro-esophageal reflux disease without esophagitis: Secondary | ICD-10-CM | POA: Diagnosis not present

## 2021-01-22 DIAGNOSIS — R1013 Epigastric pain: Secondary | ICD-10-CM

## 2021-01-22 DIAGNOSIS — R1310 Dysphagia, unspecified: Secondary | ICD-10-CM | POA: Diagnosis not present

## 2021-01-22 DIAGNOSIS — R1011 Right upper quadrant pain: Secondary | ICD-10-CM | POA: Diagnosis not present

## 2021-01-29 ENCOUNTER — Other Ambulatory Visit: Payer: Self-pay

## 2021-01-29 ENCOUNTER — Inpatient Hospital Stay: Admission: RE | Admit: 2021-01-29 | Payer: Self-pay | Source: Ambulatory Visit

## 2021-01-29 ENCOUNTER — Ambulatory Visit
Admission: EM | Admit: 2021-01-29 | Discharge: 2021-01-29 | Disposition: A | Discharge status: Home / Self Care | Payer: BC Managed Care – PPO | Attending: Urgent Care | Admitting: Urgent Care

## 2021-01-29 DIAGNOSIS — Z889 Allergy status to unspecified drugs, medicaments and biological substances status: Secondary | ICD-10-CM

## 2021-01-29 DIAGNOSIS — M7989 Other specified soft tissue disorders: Secondary | ICD-10-CM

## 2021-01-29 DIAGNOSIS — I10 Essential (primary) hypertension: Secondary | ICD-10-CM

## 2021-01-29 DIAGNOSIS — K219 Gastro-esophageal reflux disease without esophagitis: Secondary | ICD-10-CM

## 2021-01-29 MED ORDER — FAMOTIDINE 20 MG PO TABS: 20.0000 mg | ORAL_TABLET | Freq: Two times a day (BID) | ORAL | 0 refills | Status: DC

## 2021-01-29 MED ORDER — HYDROXYZINE HCL 25 MG PO TABS: 12.5000 mg | ORAL_TABLET | Freq: Three times a day (TID) | ORAL | 0 refills | Status: DC | PRN

## 2021-01-29 MED ORDER — PREDNISONE 20 MG PO TABS: ORAL_TABLET | ORAL | 0 refills | Status: DC

## 2021-01-29 NOTE — ED Triage Notes (Signed)
Pt present right hand swelling with itching. Symptoms started last night. Pt is unsure if it from a new medication he just started last Friday (pantoprozole).

## 2021-01-29 NOTE — ED Provider Notes (Signed)
[facility]   MRN: [id number] DOB: 1976-09-11  Subjective:   Tymothy Navarro is a 45 y.o. male presenting for 1 day history of acute onset right hand swelling that started over the palmar surface around the thumb and has extended to the back of the hand and the rest of his palm.  Has a history of allergic reactions, has had angioedema and tongue swelling with amlodipine and olmesartan.  The only inciting factor he can think of was recently starting pantoprazole last Friday.  He has significant acid reflux and would like a replacement for his current medication.  He has called his GI doctor but has not heard back.  Denies fever, confusion, chest tightness, headache, vision change, oral or tongue swelling, shortness of breath.  He is taking blood pressure medications for his hypertension.  No history of gout.  No trauma, falls.  No current facility-administered medications for this encounter.  Current Outpatient Medications:  .  atorvastatin (LIPITOR) 20 MG tablet, Take 20 mg by mouth daily., Disp: , Rfl:  .  diltiazem (CARDIZEM CD) 180 MG 24 hr capsule, Take 180 mg by mouth daily., Disp: , Rfl:  .  EPINEPHrine 0.3 mg/0.3 mL IJ SOAJ injection, Inject 0.3 mLs (0.3 mg total) into the muscle once., Disp: 4 Device, Rfl: 3 .  hydrochlorothiazide (HYDRODIURIL) 12.5 MG tablet, Take 12.5 mg by mouth daily., Disp: , Rfl:  .  metoprolol tartrate (LOPRESSOR) 100 MG tablet, Take 1 tablet (100 mg total) by mouth once for 1 dose. Take 2 hours prior to your Coronary CT., Disp: 1 tablet, Rfl: 0 .  Multiple Vitamin (MULTIVITAMIN) capsule, Take 1 capsule by mouth daily., Disp: , Rfl:    Allergies  Allergen Reactions  . Amlodipine     Tongue swelling  . Amlodipine-Olmesartan     Tongue swelling    Past Medical History:  Diagnosis Date  . Angioedema 09/21/2015  . BMI 36.0-36.9,adult   . DOE (dyspnea on exertion)    after a fall  . HTN (hypertension)   . Hyperlipidemia      History  reviewed. No pertinent surgical history.  Family History  Problem Relation Age of Onset  . Heart attack Father 63  . Diabetes Paternal Uncle   . Parkinson's disease Paternal Grandmother   . Breast cancer Paternal Grandmother   . CVA Paternal Grandfather     Social History   Tobacco Use  . Smoking status: Former Smoker    Packs/day: 0.08    Years: 8.00    Pack years: 0.64    Types: Cigarettes    Quit date: 11/01/2011    Years since quitting: 9.2  . Smokeless tobacco: Never Used  Substance Use Topics  . Alcohol use: Yes    Alcohol/week: 10.0 standard drinks    Types: 10 Standard drinks or equivalent per week    Comment: wine or beer  . Drug use: No    ROS   Objective:   Vitals: BP (!) 158/103 (BP Location: Left Arm)   Pulse 83   Temp 98.6 F (37 C) (Oral)   Resp 18   SpO2 96%   Physical Exam Constitutional:      General: He is not in acute distress.    Appearance: Normal appearance. He is well-developed. He is not ill-appearing, toxic-appearing or diaphoretic.  HENT:     Head: Normocephalic and atraumatic.     Right Ear: External ear normal.     Left Ear: External ear normal.  Nose: Nose normal.     Mouth/Throat:     Mouth: Mucous membranes are moist.     Pharynx: Oropharynx is clear.     Comments: Airway is patent, no oral or facial swelling. Eyes:     General: No scleral icterus.       Right eye: No discharge.        Left eye: No discharge.     Extraocular Movements: Extraocular movements intact.     Conjunctiva/sclera: Conjunctivae normal.     Pupils: Pupils are equal, round, and reactive to light.  Cardiovascular:     Rate and Rhythm: Normal rate and regular rhythm.     Heart sounds: Normal heart sounds. No murmur heard. No friction rub. No gallop.   Pulmonary:     Effort: Pulmonary effort is normal. No respiratory distress.     Breath sounds: Normal breath sounds. No stridor. No wheezing, rhonchi or rales.  Skin:    General: Skin is warm and  dry.  Neurological:     Mental Status: He is alert and oriented to person, place, and time.     Cranial Nerves: No cranial nerve deficit.     Motor: No weakness.     Coordination: Coordination normal.     Gait: Gait normal.     Deep Tendon Reflexes: Reflexes normal.     Comments: Negative Romberg and pronator drift.  Psychiatric:        Mood and Affect: Mood normal.        Behavior: Behavior normal.        Thought Content: Thought content normal.        Judgment: Judgment normal.     Assessment and Plan :   PDMP not reviewed this encounter.  1. Swelling of right hand   2. History of allergic drug reaction   3. Gastroesophageal reflux disease, unspecified whether esophagitis present   4. Essential hypertension     Clear cardiopulmonary, neurologic exam.  Emphasized need for follow-up with his PCP for recheck on his blood pressure.  No history of gout or recent trauma, will manage for a focal allergic reaction.  Stop pantoprazole, start prednisone course with hydroxyzine.  Use famotidine for acid reflux until he can follow-up with his PCP and/or gastroenterologist. Counseled patient on potential for adverse effects with medications prescribed/recommended today, strict ER and return-to-clinic precautions discussed, patient verbalized understanding.    Wallis Bamberg, PA-C 01/29/21 1201

## 2021-02-05 ENCOUNTER — Other Ambulatory Visit: Payer: Self-pay | Admitting: Physician Assistant

## 2021-02-05 DIAGNOSIS — R1013 Epigastric pain: Secondary | ICD-10-CM

## 2021-02-05 DIAGNOSIS — R1011 Right upper quadrant pain: Secondary | ICD-10-CM

## 2021-02-05 DIAGNOSIS — K219 Gastro-esophageal reflux disease without esophagitis: Secondary | ICD-10-CM

## 2021-02-05 DIAGNOSIS — R1310 Dysphagia, unspecified: Secondary | ICD-10-CM

## 2021-02-10 ENCOUNTER — Ambulatory Visit
Admission: RE | Admit: 2021-02-10 | Discharge: 2021-02-10 | Disposition: A | Discharge status: Home / Self Care | Payer: BC Managed Care – PPO | Source: Ambulatory Visit | Attending: Physician Assistant | Admitting: Physician Assistant

## 2021-02-10 ENCOUNTER — Other Ambulatory Visit: Payer: Self-pay | Admitting: Physician Assistant

## 2021-02-10 DIAGNOSIS — K7689 Other specified diseases of liver: Secondary | ICD-10-CM | POA: Diagnosis not present

## 2021-02-10 DIAGNOSIS — R1310 Dysphagia, unspecified: Secondary | ICD-10-CM

## 2021-02-10 DIAGNOSIS — K219 Gastro-esophageal reflux disease without esophagitis: Secondary | ICD-10-CM

## 2021-02-10 DIAGNOSIS — R1013 Epigastric pain: Secondary | ICD-10-CM

## 2021-02-10 DIAGNOSIS — R1011 Right upper quadrant pain: Secondary | ICD-10-CM

## 2021-02-15 ENCOUNTER — Other Ambulatory Visit: Payer: Self-pay

## 2021-02-15 ENCOUNTER — Ambulatory Visit: Payer: BC Managed Care – PPO | Admitting: Allergy

## 2021-02-15 ENCOUNTER — Encounter: Payer: Self-pay | Admitting: Allergy

## 2021-02-15 VITALS — BP 160/102 | HR 78 | Temp 98.6°F | Resp 18 | Ht 69.0 in | Wt 248.2 lb

## 2021-02-15 DIAGNOSIS — T783XXD Angioneurotic edema, subsequent encounter: Secondary | ICD-10-CM

## 2021-02-15 DIAGNOSIS — T781XXD Other adverse food reactions, not elsewhere classified, subsequent encounter: Secondary | ICD-10-CM | POA: Diagnosis not present

## 2021-02-15 DIAGNOSIS — L509 Urticaria, unspecified: Secondary | ICD-10-CM | POA: Diagnosis not present

## 2021-02-15 DIAGNOSIS — T50905D Adverse effect of unspecified drugs, medicaments and biological substances, subsequent encounter: Secondary | ICD-10-CM | POA: Diagnosis not present

## 2021-02-15 NOTE — Patient Instructions (Addendum)
Angioedema - at this time swelling episodes may have a known trigger vs spontaneous trigger vs hereditary angioedema (HAE).  Swelling can either be histamine driven (allergic pathway) or non-histamine driven (HAE),  -recommend obtaining following labs: hereditary angioedema panel, tryptase level, environmental allergy panel, alpha gal panel, serum IgE to tree nuts and fruit, hive panel -for now recommend taking long-acting antihistamine like Xyzal, Zyrtec or Allegra daily -continue Famotidine twice a day (this has antihistamine properties as well) -continue your current fruit avoidance -continue your current medication avoidance.  Based on testing will likely proceed with graded oral challenges in office to see if have IgE mediated allergy to the medications of question -should significant symptoms recur or new symptoms occur, a journal is to be kept recording any foods eaten, beverages consumed, medications taken, activities performed, and environmental conditions within a 6 hour time period prior to the onset of symptoms. For any symptoms concerning for anaphylaxis, epinephrine is to be administered and 911 is to be called immediately.   Follow-up in 3 months or sooner if needed

## 2021-02-15 NOTE — Progress Notes (Signed)
New Patient Note  RE: Matthew Navarro MRN: [id number] DOB: 1976-03-26 Date of Office Visit: 02/15/2021  Referring provider: Eunice Blase, PA-C Primary care provider: Eunice Blase, PA-C  Chief Complaint: Medication reactions  History of present illness: Matthew Navarro is a 45 y.o. male presenting today for consultation for adverse medication reactions.   He states in Oct or Nov 2021 he woke up in the night and states his tongue was swollen.  He went to the ED for this.  He believes this was related to amlodipine use ( either amlodipine-olmesartan or amlodipine besylate).  He is not sure which but states he still has both medications at home may be able to identify which one he took at that time.  He had another ED visit on 12/10/2020 for swelling of the lips.  Prior to the onset of this he only recalls starting minoxidil recent to this incident.  He states he had used this topically for 2-3 days max prior to the lip swelling.  His blood pressure was very elevated at this ED visit.  He was noted on exam to have mild angioedema of right lips without edema of tongue or pharynx.  He was treated with Benadryl IV, Decadron IV, Pepcid IV.  He was advised to stop the minoxidil.  He was in the ED on 01/29/2021 for swelling of the right hand.  On exam he was noted to have no oral or facial swelling.  The only thing he can think of as a trigger was starting pantoprazole for GERD about 1 week prior to this incident.  He states during this week he does recall some hand itching.  He states he does recall seeing a red, splotchy rash on the hand.  He was advised to stop pantoprazole.  He was treated with prednisone course and hydroxyzine.  He was recommended to use famotidine for reflux control which she takes twice a day.    He states he has had other swelling episodes where he has gone to UC on other occasions in between 2016 and 2021.    He was seen by Dr. Lucie Leather with our practice back in November 2016  for angioedema.  At this visit he demonstrated hypersensitivity against almonds and hazelnut.  Was recommended to avoid almonds and hazelnut.  He states he has had similar swelling issues with apples, kiwi and seeded fruits.  He had lab work at this visit that was negative for alpha gal, normal inflammatory marker, CMP and CBC were largely unremarkable.  A fruit IgE panel showed sensitivities to grape, apple, peach, pear and banana.    He reports previous history of seasonal allergies but has not noticed as much symptoms the past several years.   No history of asthma or eczema.  Review of systems in the past 4 weeks: Review of Systems  Constitutional: Negative.   HENT: Negative.   Eyes: Negative.   Cardiovascular: Negative.   Gastrointestinal: Negative.   Musculoskeletal: Negative.   Skin: Positive for itching and rash.  Neurological: Negative.     All other systems negative unless noted above in HPI  Past medical history: Past Medical History:  Diagnosis Date  . Angioedema 09/21/2015  . BMI 36.0-36.9,adult   . DOE (dyspnea on exertion)    after a fall  . HTN (hypertension)   . Hyperlipidemia   . Hypertension     Past surgical history: History reviewed. No pertinent surgical history.  Family history:  Family History  Problem Relation Age of Onset  .  Heart attack Father 24  . Diabetes Paternal Uncle   . Parkinson's disease Paternal Grandmother   . Breast cancer Paternal Grandmother   . CVA Paternal Grandfather   . Allergic rhinitis Neg Hx   . Angioedema Neg Hx   . Asthma Neg Hx   . Eczema Neg Hx   . Immunodeficiency Neg Hx   . Urticaria Neg Hx     Social history: Lives in a home with out carpeting with gas heating and central cooling.  There are cats and dogs in the home.  There is no concern for water damage, mildew or roaches in the home.  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 Tobacco Use  . Smoking status: Former Smoker     Packs/day: 0.08    Years: 8.00    Pack years: 0.64    Types: Cigarettes    Quit date: 11/01/2011    Years since quitting: 9.2  . Smokeless tobacco: Never Used     Medication List: Current Outpatient Medications  Medication Sig Dispense Refill  . atorvastatin (LIPITOR) 20 MG tablet Take 20 mg by mouth daily.    Marland Kitchen diltiazem (CARDIZEM CD) 180 MG 24 hr capsule Take 180 mg by mouth daily.    Marland Kitchen EPINEPHrine 0.3 mg/0.3 mL IJ SOAJ injection Inject 0.3 mLs (0.3 mg total) into the muscle once. 4 Device 3  . famotidine (PEPCID) 20 MG tablet Take 1 tablet (20 mg total) by mouth 2 (two) times daily. 60 tablet 0  . hydrochlorothiazide (HYDRODIURIL) 12.5 MG tablet Take 12.5 mg by mouth daily.    . hydrOXYzine (ATARAX/VISTARIL) 25 MG tablet Take 0.5-1 tablets (12.5-25 mg total) by mouth every 8 (eight) hours as needed for itching. 30 tablet 0  . Multiple Vitamin (MULTIVITAMIN) capsule Take 1 capsule by mouth daily.     No current facility-administered medications for this visit.    Known medication allergies: Allergies  Allergen Reactions  . Amlodipine     Tongue swelling  . Amlodipine-Olmesartan     Tongue swelling  . Pantoprazole Swelling     Physical examination: Blood pressure (!) 160/102, pulse 78, temperature 98.6 F (37 C), temperature source Temporal, resp. rate 18, height 5\' 9"  (1.753 m), weight 248 lb 3.2 oz (112.6 kg), SpO2 98 %.  General: Alert, interactive, in no acute distress. HEENT: PERRLA, TMs pearly gray, turbinates non-edematous without discharge, post-pharynx non erythematous. Neck: Supple without lymphadenopathy. Lungs: Clear to auscultation without wheezing, rhonchi or rales. {no increased work of breathing. CV: Normal S1, S2 without murmurs. Abdomen: Nondistended, nontender. Skin: Warm and dry, without lesions or rashes. Extremities:  No clubbing, cyanosis or edema. Neuro:   Grossly intact.  Diagnositics/Labs: None today  Assessment and plan:    Angioedema Urticaria Adverse medication effect Adverse food reaction  - at this time swelling episodes may have a known trigger vs spontaneous trigger vs hereditary angioedema (HAE).  Swelling can either be histamine driven (allergic pathway) or non-histamine driven (HAE),  -recommend obtaining following labs: hereditary angioedema panel, tryptase level, environmental allergy panel, alpha gal panel, serum IgE to tree nuts and fruit, hive panel -for now recommend taking long-acting antihistamine like Xyzal, Zyrtec or Allegra daily -continue Famotidine twice a day (this has antihistamine properties as well) -continue your current fruit avoidance -continue your current medication avoidance.  Based on testing will likely proceed with graded oral challenges in office to see if have IgE mediated allergy to the medications of question -should significant  symptoms recur or new symptoms occur, a journal is to be kept recording any foods eaten, beverages consumed, medications taken, activities performed, and environmental conditions within a 6 hour time period prior to the onset of symptoms. For any symptoms concerning for anaphylaxis, epinephrine is to be administered and 911 is to be called immediately.   Follow-up in 3 months or sooner if needed  I appreciate the opportunity to take part in Linnie's care. Please do not hesitate to contact me with questions.  Sincerely,   Margo Aye, MD Allergy/Immunology [facility]

## 2021-02-22 DIAGNOSIS — K219 Gastro-esophageal reflux disease without esophagitis: Secondary | ICD-10-CM | POA: Diagnosis not present

## 2021-03-04 LAB — IGE+ALLERGENS ZONE 2(30)
Alternaria Alternata IgE: <0.1 kU/L
Amer Sycamore IgE Qn: <0.1 kU/L
Aspergillus Fumigatus IgE: <0.1 kU/L
Bahia Grass IgE: <0.1 kU/L
Bermuda Grass IgE: <0.1 kU/L
Cat Dander IgE: 0.1 kU/L — AB
Cedar, Mountain IgE: <0.1 kU/L
Cladosporium Herbarum IgE: <0.1 kU/L
Cockroach, American IgE: <0.1 kU/L
Common Silver Birch IgE: 5.93 kU/L — AB
D Farinae IgE: 1.33 kU/L — AB
D Pteronyssinus IgE: 0.99 kU/L — AB
Dog Dander IgE: <0.1 kU/L
Elm, American IgE: 0.17 kU/L — AB
Hickory, White IgE: 1.42 kU/L — AB
IgE (Immunoglobulin E), Serum: 142 IU/mL (ref 6–495)
Johnson Grass IgE: <0.1 kU/L
Maple/Box Elder IgE: 0.13 kU/L — AB
Mucor Racemosus IgE: <0.1 kU/L
Mugwort IgE Qn: <0.1 kU/L
Nettle IgE: <0.1 kU/L
Oak, White IgE: 2.35 kU/L — AB
Penicillium Chrysogen IgE: <0.1 kU/L
Pigweed, Rough IgE: <0.1 kU/L
Plantain, English IgE: <0.1 kU/L
Ragweed, Short IgE: <0.1 kU/L
Sheep Sorrel IgE Qn: <0.1 kU/L
Stemphylium Herbarum IgE: <0.1 kU/L
Sweet gum IgE RAST Ql: 1.67 kU/L — AB
Timothy Grass IgE: <0.1 kU/L
White Mulberry IgE: <0.1 kU/L

## 2021-03-04 LAB — CBC WITH DIFFERENTIAL
Basophils Absolute: 0 10*3/uL (ref 0.0–0.2)
Basos: 1 %
EOS (ABSOLUTE): 0.1 10*3/uL (ref 0.0–0.4)
Eos: 1 %
Hematocrit: 47.8 % (ref 37.5–51.0)
Hemoglobin: 16.6 g/dL (ref 13.0–17.7)
Immature Grans (Abs): 0 10*3/uL (ref 0.0–0.1)
Immature Granulocytes: 0 %
Lymphocytes Absolute: 2 10*3/uL (ref 0.7–3.1)
Lymphs: 31 %
MCH: 29.3 pg (ref 26.6–33.0)
MCHC: 34.7 g/dL (ref 31.5–35.7)
MCV: 85 fL (ref 79–97)
Monocytes Absolute: 0.7 10*3/uL (ref 0.1–0.9)
Monocytes: 10 %
Neutrophils Absolute: 3.5 10*3/uL (ref 1.4–7.0)
Neutrophils: 57 %
RBC: 5.66 x10E6/uL (ref 4.14–5.80)
RDW: 12.8 % (ref 11.6–15.4)
WBC: 6.3 10*3/uL (ref 3.4–10.8)

## 2021-03-04 LAB — COMPREHENSIVE METABOLIC PANEL
ALT: 29 IU/L (ref 0–44)
AST: 21 IU/L (ref 0–40)
Albumin/Globulin Ratio: 2.3 — ABNORMAL HIGH (ref 1.2–2.2)
Albumin: 4.9 g/dL (ref 4.0–5.0)
Alkaline Phosphatase: 72 IU/L (ref 44–121)
BUN/Creatinine Ratio: 11 (ref 9–20)
BUN: 11 mg/dL (ref 6–24)
Bilirubin Total: 0.4 mg/dL (ref 0.0–1.2)
CO2: 23 mmol/L (ref 20–29)
Calcium: 9.5 mg/dL (ref 8.7–10.2)
Chloride: 98 mmol/L (ref 96–106)
Creatinine, Ser: 0.96 mg/dL (ref 0.76–1.27)
Globulin, Total: 2.1 g/dL (ref 1.5–4.5)
Glucose: 92 mg/dL (ref 65–99)
Potassium: 4.2 mmol/L (ref 3.5–5.2)
Sodium: 141 mmol/L (ref 134–144)
Total Protein: 7 g/dL (ref 6.0–8.5)
eGFR: 101 mL/min/{1.73_m2} (ref >59)

## 2021-03-04 LAB — C1 ESTERASE INHIBITOR, FUNC: C1 Est.Inhib.Funct.: >100 %mean normal

## 2021-03-04 LAB — ALLERGENS(7)
Brazil Nut IgE: <0.1 kU/L
F020-IgE Almond: 0.13 kU/L — AB
F202-IgE Cashew Nut: <0.1 kU/L
Hazelnut (Filbert) IgE: 2.51 kU/L — AB
Peanut IgE: 0.18 kU/L — AB
Pecan Nut IgE: <0.1 kU/L
Walnut IgE: <0.1 kU/L

## 2021-03-04 LAB — COMPLEMENT COMPONENT C1Q: Complement C1Q: 11.1 mg/dL (ref 10.2–20.3)

## 2021-03-04 LAB — ALLERGEN PROFILE, FOOD-FRUIT
Allergen Apple, IgE: 0.52 kU/L — AB
Allergen Banana IgE: 0.11 kU/L — AB
Allergen Grape IgE: <0.1 kU/L
Allergen Pear IgE: 0.33 kU/L — AB
Allergen, Peach f95: 0.77 kU/L — AB

## 2021-03-04 LAB — ALPHA-GAL PANEL
Allergen Lamb IgE: <0.1 kU/L
Beef IgE: <0.1 kU/L
O215-IgE Alpha-Gal: <0.1 kU/L
Pork IgE: <0.1 kU/L

## 2021-03-04 LAB — HEREDITARY ANGIOEDEMA
C2 Esterase Inhibitor, Serum: 38 mg/dL (ref 21–39)
Complement C4, Serum: 36 mg/dL (ref 12–38)

## 2021-03-04 LAB — FACTOR 12 ASSAY: Factor XII Activity: 101 % (ref 50–150)

## 2021-03-04 LAB — TRYPTASE: Tryptase: 3.8 ug/L (ref 2.2–13.2)

## 2021-03-04 LAB — HAE INTERPRETATION:

## 2021-03-04 LAB — THYROID PEROXIDASE ANTIBODY: Thyroperoxidase Ab SerPl-aCnc: 110 IU/mL — ABNORMAL HIGH (ref 0–34)

## 2021-03-04 LAB — CHRONIC URTICARIA: cu index: 25.4 — ABNORMAL HIGH (ref <10)

## 2021-03-04 LAB — THYROGLOBULIN ANTIBODY: Thyroglobulin Antibody: 4 IU/mL — ABNORMAL HIGH (ref 0.0–0.9)

## 2021-03-09 ENCOUNTER — Ambulatory Visit: Payer: BC Managed Care – PPO | Admitting: Cardiology

## 2021-03-15 ENCOUNTER — Encounter: Payer: Self-pay | Admitting: *Deleted

## 2021-03-17 ENCOUNTER — Institutional Professional Consult (permissible substitution): Payer: BC Managed Care – PPO | Admitting: Pulmonary Disease

## 2021-04-13 ENCOUNTER — Ambulatory Visit: Payer: BC Managed Care – PPO | Admitting: Internal Medicine

## 2021-04-13 ENCOUNTER — Encounter: Payer: Self-pay | Admitting: Internal Medicine

## 2021-04-13 ENCOUNTER — Other Ambulatory Visit: Payer: Self-pay

## 2021-04-13 VITALS — BP 150/100 | HR 85 | Ht 70.0 in | Wt 242.0 lb

## 2021-04-13 DIAGNOSIS — R0600 Dyspnea, unspecified: Secondary | ICD-10-CM

## 2021-04-13 DIAGNOSIS — R0609 Other forms of dyspnea: Secondary | ICD-10-CM

## 2021-04-13 DIAGNOSIS — Z889 Allergy status to unspecified drugs, medicaments and biological substances status: Secondary | ICD-10-CM | POA: Diagnosis not present

## 2021-04-13 DIAGNOSIS — R0789 Other chest pain: Secondary | ICD-10-CM

## 2021-04-13 DIAGNOSIS — Z87898 Personal history of other specified conditions: Secondary | ICD-10-CM

## 2021-04-13 NOTE — Progress Notes (Signed)
OV 04/13/2021  Subjective:  Patient ID: Matthew Navarro, male , DOB: [birth year] , age 45 y.o. , MRN: [id number] , ADDRESS: [id number] [place] [id number] PCP Eunice Blase'Buch, Greta, PA-C Patient Care Team: Otila Back'Buch, Greta, PA-C as PCP - General (Internal Medicine)  This Provider for this visit: Treatment Team:  Attending Provider: Martina Sinnerewald, Jonathan B, MD    04/13/2021 -   Chief Complaint  Patient presents with   Consult    Pt states he has been having problems trying to take a deep breath in and also states that he has some times when it feels like something might be stuck right at his sternum. States that he does have some occ SOB with exertion.     HPI Matthew PostDouglas Navarro 45 y.o. -referred for shortness of breath and chest tightness associated with a feeling of lump or sensation abnormal in the lower part of the sternum.  He tells me that approximately 2 years ago while he was in the patio a wooden log or a patio.  Pain in the lower part of the sternum and since then he is always had a sensation of being bruised in the lower part of the sternum.  Then approximately 1 year ago while he was in [place] he is walking for 20 to 30 minutes and started noticing significant exertional shortness of breath associated with wheezing that then settled down.  He had a similar episode in January 2022 when he was working outside his house to remove ice under frozen conditions and when he got home he was extremely short of breath but no wheezing.  Then again a few months ago when he was lifting a heavy couch he had the same symptom but this time he felt like there was a golf ball under his sternum.  The most recent 2 times there was no wheezing other than this 3 major episodes he feels like for doing stairs or any exertion he feels like he is not able to get a full deep breath and he has exertional shortness of breath relieved by rest.  There is no wheezing.  He says he has had a cardiac work-up  and was noncontributory.  He saw Laurance FlattenHeather Pemberton in January 2022 in February 2022.  See below.  [facility].  And he had extensive skin testing and this was positive for more than a few other things.  Review of the records indicate that in 2016 he saw Dr. Lucie LeatherKozlow.  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  Diagnosis was angioedema.  And the recommendation was avoidance measures.  Of note he has hypertension.  He admits to snoring.  But no excessive daytime somnolence.  Never been tested for sleep apnea.  CT Chest data feb 2022 Coronary CT   FINDINGS: Within the visualized portions of the thorax there are no suspicious appearing pulmonary nodules or masses, there is no acute consolidative airspace disease, no pleural effusions, no pneumothorax and no lymphadenopathy. Visualized portions of the upper abdomen are unremarkable. There are no aggressive appearing lytic or blastic lesions noted in the visualized portions of the skeleton.   IMPRESSION: 1. No significant incidental noncardiac findings are noted.   Electronically Signed: By: Trudie Reedaniel  Entrikin M.D. On: 12/28/2020 16:17   Echo feb 2022  IMPRESSIONS     1. Left ventricular ejection fraction, by estimation, is 60 to 65%. The  left ventricle has normal function. The left ventricle has  no regional  wall motion abnormalities. Left ventricular diastolic parameters were  normal.   2. Right ventricular systolic function is normal. The right ventricular  size is normal. Tricuspid regurgitation signal is inadequate for assessing  PA pressure.   3. The mitral valve is grossly normal. No evidence of mitral valve  regurgitation. No evidence of mitral stenosis.   4. The aortic valve is tricuspid. Aortic valve regurgitation is not  visualized. No aortic stenosis is present.   5. The inferior vena cava is normal in size with greater than 50%  respiratory variability, suggesting right atrial  pressure of 3 mmHg.   Conclusion(s)/Recommendation(s): Normal biventricular function without  evidence of hemodynamically significant valvular heart disease.   Blood work  Results for ION, GONNELLA (MRN [id number]) as of 04/13/2021 14:45  Ref. Range 02/15/2021 16:05  Hemoglobin Latest Ref Range: 13.0 - 17.7 g/dL 25.4   Results for EPHRIAM, TURMAN (MRN [id number]) as of 04/13/2021 14:45  Ref. Range 02/15/2021 16:05  Creatinine Latest Ref Range: 0.76 - 1.27 mg/dL 8.31     has a past medical history of Angioedema (09/21/2015), BMI 36.0-36.9,adult, DOE (dyspnea on exertion), HTN (hypertension), Hyperlipidemia, and Hypertension.   reports that he quit smoking about 9 years ago. His smoking use included cigarettes. He has a 0.64 pack-year smoking history. He has never used smokeless tobacco.  No past surgical history on file.  Allergies  Allergen Reactions   Amlodipine     Tongue swelling   Amlodipine-Olmesartan     Tongue swelling   Pantoprazole Swelling    Immunization History  Administered Date(s) Administered   PFIZER(Purple Top)SARS-COV-2 Vaccination 01/16/2020, 02/10/2020, 09/19/2020    Family History  Problem Relation Age of Onset   Heart attack Father 94   Diabetes Paternal Uncle    Parkinson's disease Paternal Grandmother    Breast cancer Paternal Grandmother    CVA Paternal Grandfather    Allergic rhinitis Neg Hx    Angioedema Neg Hx    Asthma Neg Hx    Eczema Neg Hx    Immunodeficiency Neg Hx    Urticaria Neg Hx      Current Outpatient Medications:    atorvastatin (LIPITOR) 20 MG tablet, Take 20 mg by mouth daily., Disp: , Rfl:    diltiazem (CARDIZEM CD) 180 MG 24 hr capsule, Take 180 mg by mouth daily., Disp: , Rfl:    EPINEPHrine 0.3 mg/0.3 mL IJ SOAJ injection, Inject 0.3 mLs (0.3 mg total) into the muscle once., Disp: 4 Device, Rfl: 3   famotidine (PEPCID) 20 MG tablet, Take 1 tablet (20 mg total) by mouth 2 (two) times daily., Disp: 60 tablet, Rfl:  0   hydrochlorothiazide (HYDRODIURIL) 12.5 MG tablet, Take 12.5 mg by mouth daily., Disp: , Rfl:    Multiple Vitamin (MULTIVITAMIN) capsule, Take 1 capsule by mouth daily., Disp: , Rfl:    hydrOXYzine (ATARAX/VISTARIL) 25 MG tablet, Take 0.5-1 tablets (12.5-25 mg total) by mouth every 8 (eight) hours as needed for itching. (Patient not taking: Reported on 04/13/2021), Disp: 30 tablet, Rfl: 0      Objective:   Vitals:   04/13/21 1440  BP: (!) 150/100  Pulse: 85  SpO2: 98%  Weight: 242 lb (109.8 kg)  Height: 5\' 10"  (1.778 m)    Estimated body mass index is 34.72 kg/m as calculated from the following:   Height as of this encounter: 5\' 10"  (1.778 m).   Weight as of this encounter: 242 lb (109.8 kg).  @WEIGHTCHANGE @   04/13/21 1440  Weight: 242 lb (109.8 kg)     Physical Exam  General Appearance:    Alert, cooperative, no distress, appears stated age - yes , Deconditioned looking - no , OBESE  - yes, Sitting on Wheelchair -  no  Head:    Normocephalic, without obvious abnormality, atraumatic  Eyes:    PERRL, conjunctiva/corneas clear,  Ears:    Normal TM's and external ear canals, both ears  Nose:   Nares normal, septum midline, mucosa normal, no drainage    or sinus tenderness. OXYGEN ON  - no . Patient is @ ra   Throat:   Lips, mucosa, and tongue normal; teeth and gums normal. Cyanosis on lips - no  Neck:   Supple, symmetrical, trachea midline, no adenopathy;    thyroid:  no enlargement/tenderness/nodules; no carotid   bruit or JVD  Back:     Symmetric, no curvature, ROM normal, no CVA tenderness  Lungs:     Distress - no , Wheeze no, Barrell Chest - no, Purse lip breathing - no, Crackles - no   Chest Wall:    No tenderness or deformity.    Heart:    Regular rate and rhythm, S1 and S2 normal, no rub   or gallop, Murmur - no  Breast Exam:    NOT DONE  Abdomen:     Soft, non-tender, bowel sounds active all four quadrants,    no masses, no organomegaly.  Visceral obesity - yes  Genitalia:   NOT DONE  Rectal:   NOT DONE  Extremities:   Extremities - normal, Has Cane - no, Clubbing - no, Edema - no  Pulses:   2+ and symmetric all extremities  Skin:   Stigmata of Connective Tissue Disease - no  Lymph nodes:   Cervical, supraclavicular, and axillary nodes normal  Psychiatric:  Neurologic:   Pleasant - yes, Anxious - no, Flat affect - no  CAm-ICU - neg, Alert and Oriented x 3 - yes, Moves all 4s - yes, Speech - normal, Cognition - intact          Assessment:       ICD-10-CM   1. Dyspnea on exertion  R06.00     2. Chest tightness  R07.89     3. History of seasonal allergies  Z88.9     4. History of snoring  Z87.898          Plan:     Patient Instructions  Dyspnea on exertion Chest tightness  -Unclear cause of symptoms including unclear sensation of lump in the lower part of the breastbone  Plan - Do high-resolution CT chest without contrast supine and prone - Do full pulmonary function test - Do a breathing test called feNo test -Sign release to get your allergy test results from [facility]  History of seasonal allergies  -Sign release to get allergy test results from [facility]  History of snoring  -You might have sleep apnea  Plan - Refer to one of her sleep doctors  Follow-up - Telephone visit or face-to-face visit with nurse practitioner but after completing pulmonary function test, Fino test and CT scan of the chest -> if these are inconclusive or noncontributory we might have to do cardiopulmonary stress test on a bike    SIGNATURE    Dr. Kalman Shan, M.D., F.C.C.P,  Pulmonary and Critical Care Medicine Staff Physician, [facility] Center Director - Interstitial Lung Disease  Program  Pulmonary Fibrosis [facility]  Network at [place], [place], [address]  Pager: [phone], If no answer or between  15:00h - 7:00h: call [phone] Telephone: [phone]  3:11 PM 04/13/2021

## 2021-04-13 NOTE — Addendum Note (Signed)
Addended by: Wyvonne Lenz on: 04/13/2021 03:22 PM   Modules accepted: Orders

## 2021-04-13 NOTE — Patient Instructions (Signed)
Dyspnea on exertion Chest tightness  -Unclear cause of symptoms including unclear sensation of lump in the lower part of the breastbone  Plan - Do high-resolution CT chest without contrast supine and prone - Do full pulmonary function test - Do a breathing test called feNo test -Sign release to get your allergy test results from [facility]  History of seasonal allergies  -Sign release to get allergy test results from [facility]  History of snoring  -You might have sleep apnea  Plan - Refer to one of her sleep doctors  Follow-up - Telephone visit or face-to-face visit with nurse practitioner but after completing pulmonary function test, Fino test and CT scan of the chest -> if these are inconclusive or noncontributory we might have to do cardiopulmonary stress test on a bike

## 2021-04-14 ENCOUNTER — Telehealth: Payer: Self-pay

## 2021-04-14 DIAGNOSIS — Z006 Encounter for examination for normal comparison and control in clinical research program: Secondary | ICD-10-CM

## 2021-04-14 NOTE — Telephone Encounter (Signed)
I called patient for his 90-day Identify Study follow up phone call. Patient is doing well with no cardiac symptoms at this time. I reminded patient I would call him in February for his 1 year follow-up. 

## 2021-04-20 ENCOUNTER — Other Ambulatory Visit: Payer: Self-pay

## 2021-04-20 ENCOUNTER — Ambulatory Visit (INDEPENDENT_AMBULATORY_CARE_PROVIDER_SITE_OTHER)
Admission: RE | Admit: 2021-04-20 | Discharge: 2021-04-20 | Disposition: A | Discharge status: Home / Self Care | Payer: BC Managed Care – PPO | Source: Ambulatory Visit | Attending: Internal Medicine | Admitting: Internal Medicine

## 2021-04-20 DIAGNOSIS — R0600 Dyspnea, unspecified: Secondary | ICD-10-CM

## 2021-04-20 DIAGNOSIS — I700 Atherosclerosis of aorta: Secondary | ICD-10-CM | POA: Diagnosis not present

## 2021-04-20 DIAGNOSIS — I2510 Atherosclerotic heart disease of native coronary artery without angina pectoris: Secondary | ICD-10-CM | POA: Diagnosis not present

## 2021-04-20 DIAGNOSIS — R0609 Other forms of dyspnea: Secondary | ICD-10-CM

## 2021-04-25 ENCOUNTER — Telehealth: Payer: Self-pay | Admitting: Internal Medicine

## 2021-04-25 NOTE — Telephone Encounter (Signed)
Thank you so much for seeing him and reaching out to [place]. Absolutely okay to do a CPST on bike. His coronary CTA showed only mild disease in his LAD with no significant obstruction.  Thank you again!

## 2021-04-25 NOTE — Telephone Encounter (Signed)
Hi Dr Dustin Folks  Saw [place] for DOE. His CT sows CAD caclification in LAD. Not sure how this correlates with his CT caclium score but thought will past it on since he is only 44. Our next step is to do a CPST on bike? Is that ok to do from cardiology standpoint?  Thanks    SIGNATURE    Dr. Kalman Shan, M.D., F.C.C.P,  Pulmonary and Critical Care Medicine Staff Physician, [facility] Center Director - Interstitial Lung Disease  Program  Pulmonary Fibrosis [facility] Network at [facility], [place], [address]  Pager: [phone], If no answer  OR between  19:00-7:00h: page [phone] Telephone (clinical office): [phone] Telephone (research): [phone]  12:02 PM 04/25/2021

## 2021-04-25 NOTE — Progress Notes (Signed)
  IMPRESSION: 1. No evidence of interstitial lung disease. 2. No acute findings in the thorax. 3. Aortic atherosclerosis, in addition to left anterior descending coronary artery disease. Please note that although the presence of coronary artery calcium documents the presence of coronary artery disease, the severity of this disease and any potential stenosis cannot be assessed on this non-gated CT examination. Assessment for potential risk factor modification, dietary therapy or pharmacologic therapy may be warranted, if clinically indicated.  Aortic Atherosclerosis (ICD10-I70.0).   Electronically Signed   By: Trudie Reed M.D.   On: 04/21/2021 07:23   Xxxx Results to be d/w Buelah Manis

## 2021-04-28 NOTE — Telephone Encounter (Signed)
[name]: Please arrange for CPST bike test with EIB challenge. Indication: dyspnea -. Then telephone or video or face/face visit with me  Dr Shari Prows: thanks and reply not needed  Thanks    SIGNATURE    Dr. Kalman Shan, M.D., F.C.C.P,  Pulmonary and Critical Care Medicine Staff Physician, [facility] Center Director - Interstitial Lung Disease  Program  Pulmonary Fibrosis [facility] Network at [facility], [place], [address]  Pager: [phone], If no answer  OR between  19:00-7:00h: page [phone] Telephone (clinical office): [phone] Telephone (research): [phone]  7:07 AM 04/28/2021

## 2021-05-16 NOTE — Progress Notes (Deleted)
Cardiology Office Note:    Date:  05/16/2021   ID:  Matthew Navarro, DOB [birth year], MRN [id number]  PCP:  Eunice Blase, PA-C  [facility] Cardiologist:  None  [facility] Electrophysiologist:  None   Referring MD: Eunice Blase, PA-C    History of Present Illness:    Matthew Navarro is a 45 y.o. male with a hx of HTN, HLD, and angioedema who was referred by Claiborne Rigg, PA-C for further management of dyspnea on exertion.   Patient states that back in September, he was walking in [place] on many inclines and he felt very short of breath with some wheezing and chest pressure at the time. He stopped and rested and his symptoms abated. Had a few other minor episodes while working out, but nothing severe until a couple of weeks ago when he was out clearing the ice and he felt like he couldn't take a full deep breath. Again, he had some chest pressure at that time. He sat down and symptoms resolved. No known history of asthma or exercise induced asthma. No known personal history of CAD but has a strong family history as detailed below. Symptoms only occurred with exertion and do not occur with rest.   Prior smoker x 10 years. Quit several years ago (used to smoke 1-2packs per week)  Family history: Paternal GF: CVA, Paternal GM: CAD, Dad with MI (age 66; smoker and drinker).   Past Medical History:  Diagnosis Date   Angioedema 09/21/2015   BMI 36.0-36.9,adult    DOE (dyspnea on exertion)    after a fall   HTN (hypertension)    Hyperlipidemia    Hypertension     No past surgical history on file.  Current Medications: No outpatient medications have been marked as taking for the 05/18/21 encounter (Appointment) with Meriam Sprague, MD.     Allergies:   Amlodipine, Amlodipine-olmesartan, and Pantoprazole   Social History   Socioeconomic History   Marital status: Single    Spouse name: Not on file   Number of children: Not on file   Years of education: Not on  file   Highest education level: Not on file  Occupational History   Not on file  Tobacco Use   Smoking status: Former    Packs/day: 0.08    Years: 8.00    Pack years: 0.64    Types: Cigarettes    Quit date: 11/01/2011    Years since quitting: 9.5   Smokeless tobacco: Never  Vaping Use   Vaping Use: Never used  Substance and Sexual Activity   Alcohol use: Yes    Alcohol/week: 10.0 standard drinks    Types: 10 Standard drinks or equivalent per week    Comment: wine or beer   Drug use: No   Sexual activity: Not on file  Other Topics Concern   Not on file  Social History Narrative   Not on file   Social Determinants of Health   Financial Resource Strain: Not on file  Food Insecurity: Not on file  Transportation Needs: Not on file  Physical Activity: Not on file  Stress: Not on file  Social Connections: Not on file     Family History: The patient's family history includes Breast cancer in his paternal grandmother; CVA in his paternal grandfather; Diabetes in his paternal uncle; Heart attack (age of onset: 36) in his father; Parkinson's disease in his paternal grandmother. There is no history of Allergic rhinitis, Angioedema, Asthma, Eczema, Immunodeficiency, or Urticaria.  ROS:   Please see the history of present illness.    Review of Systems  Constitutional:  Negative for chills and fever.  HENT:  Negative for nosebleeds.   Eyes:  Negative for blurred vision.  Respiratory:  Positive for shortness of breath.   Cardiovascular:  Positive for chest pain. Negative for palpitations, orthopnea, claudication, leg swelling and PND.  Gastrointestinal:  Negative for nausea and vomiting.  Genitourinary:  Negative for hematuria.  Musculoskeletal:  Negative for falls.  Neurological:  Negative for dizziness and loss of consciousness.  Endo/Heme/Allergies:  Negative for polydipsia.  Psychiatric/Behavioral:  Negative for substance abuse.    EKGs/Labs/Other Studies Reviewed:    The  following studies were reviewed today: No cardiac studies  EKG:  EKG is  ordered today.  The ekg ordered today demonstrates NSR with HR 73  Recent Labs: 02/15/2021: ALT 29; BUN 11; Creatinine, Ser 0.96; Hemoglobin 16.6; Potassium 4.2; Sodium 141  Recent Lipid Panel No results found for: CHOL, TRIG, HDL, CHOLHDL, VLDL, LDLCALC, LDLDIRECT   Physical Exam:    VS:  There were no vitals taken for this visit.    Wt Readings from Last 3 Encounters:  04/13/21 242 lb (109.8 kg)  02/15/21 248 lb 3.2 oz (112.6 kg)  11/27/20 257 lb (116.6 kg)     GEN:  Well nourished, well developed in no acute distress HEENT: Normal NECK: No JVD; No carotid bruits CARDIAC: RRR, no murmurs, rubs, gallops RESPIRATORY:  Clear to auscultation without rales, wheezing or rhonchi  ABDOMEN: Soft, non-tender, non-distended MUSCULOSKELETAL:  No edema; No deformity  SKIN: Warm and dry NEUROLOGIC:  Alert and oriented x 3 PSYCHIATRIC:  Normal affect   ASSESSMENT:    No diagnosis found.  PLAN:    In order of problems listed above:  #Dyspnea on Exertion: #Chest Pain: Patient with episodes of exertional dyspnea and associated chest pressure. Specifically, he feels that during times of significant exertion, he gets suddenly very short of breath, develops chest pressure and feels like he cannot get enough air in. Unclear etiology, however, patient has risk factors including HTN, HLD, and positive family history of CAD. Will check coronary CTA and echo and if negative, recommend referral to Pulm for further work-up. -Check coronary CTA -Check TTE  #HTN: Well controlled. Managed by PCP. -Continue dilt and HCTZ -Has angioedema with ACE/ARBs  #HLD: Managed by PCP. -Continue atorvastatin 20mg  daily    Medication Adjustments/Labs and Tests Ordered: Current medicines are reviewed at length with the patient today.  Concerns regarding medicines are outlined above.  No orders of the defined types were placed in  this encounter.  No orders of the defined types were placed in this encounter.   There are no Patient Instructions on file for this visit.    Signed, , MD  05/16/2021 5:33 PM    [facility]

## 2021-05-18 ENCOUNTER — Other Ambulatory Visit (HOSPITAL_COMMUNITY)
Admission: RE | Admit: 2021-05-18 | Discharge: 2021-05-18 | Disposition: A | Discharge status: Home / Self Care | Payer: BC Managed Care – PPO | Source: Ambulatory Visit | Attending: Internal Medicine | Admitting: Internal Medicine

## 2021-05-18 ENCOUNTER — Ambulatory Visit: Payer: BC Managed Care – PPO | Admitting: Cardiology

## 2021-05-18 DIAGNOSIS — Z01812 Encounter for preprocedural laboratory examination: Secondary | ICD-10-CM | POA: Insufficient documentation

## 2021-05-18 DIAGNOSIS — Z20822 Contact with and (suspected) exposure to covid-19: Secondary | ICD-10-CM | POA: Insufficient documentation

## 2021-05-18 LAB — SARS CORONAVIRUS 2 (TAT 6-24 HRS): SARS Coronavirus 2: NEGATIVE

## 2021-05-21 ENCOUNTER — Ambulatory Visit (INDEPENDENT_AMBULATORY_CARE_PROVIDER_SITE_OTHER): Payer: BC Managed Care – PPO | Admitting: Internal Medicine

## 2021-05-21 ENCOUNTER — Encounter: Payer: Self-pay | Admitting: Primary Care

## 2021-05-21 ENCOUNTER — Other Ambulatory Visit: Payer: Self-pay

## 2021-05-21 ENCOUNTER — Ambulatory Visit: Payer: BC Managed Care – PPO | Admitting: Primary Care

## 2021-05-21 VITALS — BP 160/110 | HR 87 | Ht 69.0 in | Wt 239.2 lb

## 2021-05-21 DIAGNOSIS — J302 Other seasonal allergic rhinitis: Secondary | ICD-10-CM | POA: Diagnosis not present

## 2021-05-21 DIAGNOSIS — R0600 Dyspnea, unspecified: Secondary | ICD-10-CM

## 2021-05-21 DIAGNOSIS — K219 Gastro-esophageal reflux disease without esophagitis: Secondary | ICD-10-CM | POA: Diagnosis not present

## 2021-05-21 DIAGNOSIS — Z889 Allergy status to unspecified drugs, medicaments and biological substances status: Secondary | ICD-10-CM

## 2021-05-21 DIAGNOSIS — R0609 Other forms of dyspnea: Secondary | ICD-10-CM | POA: Insufficient documentation

## 2021-05-21 DIAGNOSIS — I10 Essential (primary) hypertension: Secondary | ICD-10-CM | POA: Insufficient documentation

## 2021-05-21 LAB — PULMONARY FUNCTION TEST
DL/VA % pred: 109 %
DL/VA: 5.04 ml/min/mmHg/L
DLCO cor % pred: 122 %
DLCO cor: 36.33 ml/min/mmHg
DLCO unc % pred: 122 %
DLCO unc: 36.33 ml/min/mmHg
FEF 25-75 Post: 4.42 L/sec
FEF 25-75 Pre: 4.16 L/sec
FEF2575-%Change-Post: 6 %
FEF2575-%Pred-Post: 118 %
FEF2575-%Pred-Pre: 111 %
FEV1-%Change-Post: 1 %
FEV1-%Pred-Post: 108 %
FEV1-%Pred-Pre: 106 %
FEV1-Post: 4.33 L
FEV1-Pre: 4.26 L
FEV1FVC-%Change-Post: 2 %
FEV1FVC-%Pred-Pre: 102 %
FEV6-%Change-Post: -1 %
FEV6-%Pred-Post: 104 %
FEV6-%Pred-Pre: 106 %
FEV6-Post: 5.17 L
FEV6-Pre: 5.23 L
FEV6FVC-%Change-Post: 0 %
FEV6FVC-%Pred-Post: 102 %
FEV6FVC-%Pred-Pre: 102 %
FVC-%Change-Post: -1 %
FVC-%Pred-Post: 102 %
FVC-%Pred-Pre: 103 %
FVC-Post: 5.17 L
FVC-Pre: 5.24 L
Post FEV1/FVC ratio: 84 %
Post FEV6/FVC ratio: 100 %
Pre FEV1/FVC ratio: 81 %
Pre FEV6/FVC Ratio: 100 %
RV % pred: 25 %
RV: 0.46 L
TLC % pred: 90 %
TLC: 6.12 L

## 2021-05-21 LAB — POCT EXHALED NITRIC OXIDE: FeNO level (ppb): 15

## 2021-05-21 NOTE — Progress Notes (Signed)
PFT done today. 

## 2021-05-21 NOTE — Patient Instructions (Addendum)
CT imaging showed no acute findings in the lungs to explain shortness of breath. No evidence of pulmonary fibrosis, aortic atherosclerosis and left descending coronary artery disease   Pulmonary function testing today appears normal, possible vocal cord dysfunction versus extrathoracic obstruction (above the cords) seen on flow volume looks on PFTs today. FENO was normal  Looks like you last saw cardiology in January 2022, they wanted to see you back in 3 months. Recommend you schedule a follow-up with their office to review hypertension management and coronary artery disease seen in imaging. May consider exercise stress test if still having shortness of breath and chest tightness symptoms   Recommendations: Continue Lipitor 20mg  daily Continue Diltiazem 180mg  daily Continue HCTZ 12.5mg  daily  Start OTC antihistamine such as Xyzal OR Zyrtec   Refer: ENT re: possible vocal cord dysfunction vs extrathoracic obstruction    Orders: Cardiopulmonary exercise stress test re: Dyspnea   Follow-up: 3-6 months with Dr. 

## 2021-05-21 NOTE — Progress Notes (Signed)
@Patient  ID: Matthew Navarro, male    DOB: Apr 24, 1976, 45 y.o.   MRN: [id number]  Chief Complaint  Patient presents with   Shortness of Breath    PTF     Referring provider: [id number]  HPI: 45 year old male, former light smoker quit in 2013 (<1 pack year hx).  Past medical history significant angioedema, dyspnea on exertion, hypertension, hyperlipidemia.  Patient of Dr. 2014, seen for initial consult on 04/13/2021 for dyspnea on exertion.  05/21/2021- Interim  Patient presents today for follow-up visit with pulmonary function testing. He has no acute complaints today. He notices dyspnea and chest tightness with exertion only.  Blood pressure has been elevated the last 2 visits in our office, he is on diltiazem 180 mg once daily along with hydrochlorothiazide 12.5 mg daily. He gets his cholesterol level check through his PCP, tells me his numbers were mostly normal. He is compliant with Lipitor 20mg  daily and the rest of his blood pressure medications. He does have GERD symptoms and occasionally feels the sensation that he is chocking. This has been better since he started Pepcid twice a day. Allergy panel with [facility] were significantly positive for white hickory, , sweet gum, hazelnut, peach. Eosinophils absolute 100, IgE 134.     Pulmonary testing 05/21/2021 PFTs- FVC 5.17 (102%), FEV1 4.33 (108%), ratio 84, TLC 90%, DLCO 36.33 (122%) Normal spirometry without bronchodilator response.  Normal diffusion capacity.  05/21/2021 FENO-  15  Imaging:  HRCT 04/21/2021-no evidence of interstitial lung disease, no acute findings in thorax.  Aortic arthrosclerosis, in addition to left anterior descending coronary artery disease.  Labs: May 2022- Allergy panel significantly positive for dust mites, Common silver birch, white oak, white hickory, sweet gum, hazelnut, peach. Eosinophil alsolute 100, IgE 134.    Allergies  Allergen Reactions   Amlodipine      Tongue swelling   Amlodipine-Olmesartan     Tongue swelling   Pantoprazole Swelling    Immunization History  Administered Date(s) Administered   PFIZER(Purple Top)SARS-COV-2 Vaccination 01/16/2020, 02/10/2020, 09/19/2020    Past Medical History:  Diagnosis Date   Angioedema 09/21/2015   BMI 36.0-36.9,adult    DOE (dyspnea on exertion)    after a fall   HTN (hypertension)    Hyperlipidemia    Hypertension     Tobacco History: Social History   Tobacco Use  Smoking Status Former   Packs/day: 0.08   Years: 8.00   Pack years: 0.64   Types: Cigarettes   Start date: 1998   Quit date: 11/01/2011   Years since quitting: 9.5  Smokeless Tobacco Never   Counseling given: Not Answered   Outpatient Medications Prior to Visit  Medication Sig Dispense Refill   atorvastatin (LIPITOR) 20 MG tablet Take 20 mg by mouth daily.     diltiazem (CARDIZEM CD) 180 MG 24 hr capsule Take 180 mg by mouth daily.     EPINEPHrine 0.3 mg/0.3 mL IJ SOAJ injection Inject 0.3 mLs (0.3 mg total) into the muscle once. 4 Device 3   famotidine (PEPCID) 20 MG tablet Take 1 tablet (20 mg total) by mouth 2 (two) times daily. 60 tablet 0   hydrochlorothiazide (HYDRODIURIL) 12.5 MG tablet Take 12.5 mg by mouth daily.     hydrOXYzine (ATARAX/VISTARIL) 25 MG tablet Take 0.5-1 tablets (12.5-25 mg total) by mouth every 8 (eight) hours as needed for itching. 30 tablet 0   Multiple Vitamin (MULTIVITAMIN) capsule Take 1 capsule by mouth daily.  No facility-administered medications prior to visit.   Review of Systems  Review of Systems  Constitutional: Negative.   HENT: Negative.    Respiratory:  Negative for cough, chest tightness, shortness of breath and wheezing.        Exertional shortness of breat and chest tightness  Cardiovascular: Negative.     Physical Exam  BP (!) 160/110   Pulse 87   Ht 5\' 9"  (1.753 m)   Wt 239 lb 3.2 oz (108.5 kg)   SpO2 97%   BMI 35.32 kg/m  Physical  Exam Constitutional:      Appearance: He is well-developed.  HENT:     Head: Normocephalic and atraumatic.     Mouth/Throat:     Mouth: Mucous membranes are moist.     Pharynx: Oropharynx is clear.  Cardiovascular:     Rate and Rhythm: Normal rate and regular rhythm.  Pulmonary:     Effort: Pulmonary effort is normal.     Breath sounds: Normal breath sounds.     Comments: CTA Musculoskeletal:        General: Normal range of motion.  Skin:    General: Skin is warm and dry.  Neurological:     General: No focal deficit present.     Mental Status: He is alert and oriented to person, place, and time. Mental status is at baseline.  Psychiatric:        Mood and Affect: Mood normal.        Behavior: Behavior normal.        Thought Content: Thought content normal.        Judgment: Judgment normal.     Lab Results:  CBC    Component Value Date/Time   WBC 6.3 02/15/2021 1605   RBC 5.66 02/15/2021 1605   HGB 16.6 02/15/2021 1605   HCT 47.8 02/15/2021 1605   PLT 181 09/21/2015 1605   MCV 85 02/15/2021 1605   MCH 29.3 02/15/2021 1605   MCHC 34.7 02/15/2021 1605   RDW 12.8 02/15/2021 1605   LYMPHSABS 2.0 02/15/2021 1605   EOSABS 0.1 02/15/2021 1605   BASOSABS 0.0 02/15/2021 1605    BMET    Component Value Date/Time   NA 141 02/15/2021 1605   K 4.2 02/15/2021 1605   CL 98 02/15/2021 1605   CO2 23 02/15/2021 1605   GLUCOSE 92 02/15/2021 1605   BUN 11 02/15/2021 1605   CREATININE 0.96 02/15/2021 1605   CALCIUM 9.5 02/15/2021 1605   GFRNONAA 109 09/21/2015 1605   GFRAA 126 09/21/2015 1605    BNP No results found for: BNP  ProBNP No results found for: PROBNP  Imaging: No results found.   Assessment & Plan:   Hypertension - BP has been elevated the last several office visits. Today it was 160/110. No ACS symptoms. He is compliant with Diltiazem 180mg  daily and HCTZ 12.5mg  daily.  He is established with cardiology, overdue for follow-up.   Dyspnea on exertion -  Patient experiences dyspnea on exertion with associated chest tightness. PFTs were normal without evidence of restrictive or obstructive lung disease. He had no bronchodilator response after albuterol and his FENO was normal. I do not recommend that he be started on any bronchodilator regimen at this time. He had some flattening of his flow volume loop with inspiration, we will refer him to ENT to evaluate for possible VCD vs extrathoracic obstruction. He should also have an cardiopulmonary exercise stress test d/t dyspnea symptoms and hx CAD. FU with Dr.  Ramaswamy in 3-6 months or sooner if needed.   Seasonal allergies -  Seen originally by [facility] due to angioedema from Amlodipine. Allergy panel in May 2022 was significantly positive for dust mites, Common silver birch, white oak, white hickory, sweet gum, hazelnut, peach. Eosinophil alsolute 100, IgE 134.  - Recommend her start over the counter anithistamine such as Xyzal or Zyrtec. Consider adding Singulair 10mg  at bedtime if needed   GERD (gastroesophageal reflux disease) - Continue famotidine 20mg  BID  40 mins spent, >50% face to face with patient   , NP 05/21/2021

## 2021-05-21 NOTE — Assessment & Plan Note (Signed)
-   Patient experiences dyspnea on exertion with associated chest tightness. PFTs were normal without evidence of restrictive or obstructive lung disease. He had no bronchodilator response after albuterol and his FENO was normal. I do not recommend that he be started on any bronchodilator regimen at this time. He had some flattening of his flow volume loop with inspiration, we will refer him to ENT to evaluate for possible VCD vs extrathoracic obstruction. He should also have an cardiopulmonary exercise stress test d/t dyspnea symptoms and hx CAD. FU with Dr. Marchelle Gearing in 3-6 months or sooner if needed.

## 2021-05-21 NOTE — Assessment & Plan Note (Signed)
-   Continue famotidine 20mg  BID

## 2021-05-21 NOTE — Addendum Note (Signed)
Addended by: Pilar Grammes on: 05/21/2021 04:39 PM   Modules accepted: Orders

## 2021-05-21 NOTE — Assessment & Plan Note (Signed)
-   BP has been elevated the last several office visits. Today it was 160/110. No ACS symptoms. He is compliant with Diltiazem 180mg  daily and HCTZ 12.5mg  daily.  He is established with cardiology, overdue for follow-up.

## 2021-05-21 NOTE — Assessment & Plan Note (Addendum)
-    Seen originally by [facility] due to angioedema from Amlodipine. Allergy panel in May 2022 was significantly positive for dust mites, Common silver birch, white oak, white hickory, sweet gum, hazelnut, peach. Eosinophil alsolute 100, IgE 134.  - Recommend her start over the counter anithistamine such as Xyzal or Zyrtec. Consider adding Singulair 10mg  at bedtime if needed

## 2021-05-24 ENCOUNTER — Encounter: Payer: Self-pay | Admitting: Allergy

## 2021-05-24 ENCOUNTER — Ambulatory Visit: Payer: BC Managed Care – PPO | Admitting: Allergy

## 2021-05-24 ENCOUNTER — Other Ambulatory Visit: Payer: Self-pay

## 2021-05-24 VITALS — BP 162/108 | HR 80 | Temp 98.0°F | Resp 20

## 2021-05-24 DIAGNOSIS — T50905D Adverse effect of unspecified drugs, medicaments and biological substances, subsequent encounter: Secondary | ICD-10-CM

## 2021-05-24 DIAGNOSIS — T783XXD Angioneurotic edema, subsequent encounter: Secondary | ICD-10-CM

## 2021-05-24 NOTE — Progress Notes (Signed)
Follow-up Note  RE: Matthew Navarro MRN: [id number] DOB: 22-Jul-1976 Date of Office Visit: 05/24/2021   History of present illness: Matthew Navarro is a 45 y.o. male presenting today for follow-up of angioedema.  He was last seen in the office on 02/15/21 by myself.  He has done well since last visit without any health changes, surgeries or hospitalizations.  He has not had any further swelling since last visit.  He has continued off amlodopine, pantoprazole and minoxidil.  He also continues to avoid peanuts/tree nuts as well as certain fruits.  He states he can do without these foods in the diet.  He does want to address with his PCP his BP control on his current medication.  He will have access to H1/H2 blockade in case of any future swelling/hive episodes.   Review of systems: Review of Systems  Constitutional: Negative.   HENT: Negative.    Eyes: Negative.   Respiratory: Negative.    Cardiovascular: Negative.   Gastrointestinal: Negative.   Musculoskeletal: Negative.   Skin: Negative.   Neurological: Negative.    All other systems negative unless noted above in HPI  Past medical/social/surgical/family history have been reviewed and are unchanged unless specifically indicated below.  No changes  Medication List: Current Outpatient Medications  Medication Sig Dispense Refill   atorvastatin (LIPITOR) 20 MG tablet Take 20 mg by mouth daily.     diltiazem (CARDIZEM CD) 180 MG 24 hr capsule Take 180 mg by mouth daily.     EPINEPHrine 0.3 mg/0.3 mL IJ SOAJ injection Inject 0.3 mLs (0.3 mg total) into the muscle once. 4 Device 3   hydrochlorothiazide (HYDRODIURIL) 12.5 MG tablet Take 12.5 mg by mouth daily.     Multiple Vitamin (MULTIVITAMIN) capsule Take 1 capsule by mouth daily.     famotidine (PEPCID) 20 MG tablet Take 1 tablet (20 mg total) by mouth 2 (two) times daily. (Patient not taking: Reported on 05/24/2021) 60 tablet 0   hydrOXYzine (ATARAX/VISTARIL) 25 MG tablet Take  0.5-1 tablets (12.5-25 mg total) by mouth every 8 (eight) hours as needed for itching. (Patient not taking: Reported on 05/24/2021) 30 tablet 0   No current facility-administered medications for this visit.     Known medication allergies: Allergies  Allergen Reactions   Amlodipine     Tongue swelling   Amlodipine-Olmesartan     Tongue swelling   Pantoprazole Swelling     Physical examination: Blood pressure (!) 162/108, pulse 80, temperature 98 F (36.7 C), temperature source Temporal, resp. rate 20, SpO2 98 %.  General: Alert, interactive, in no acute distress. HEENT: PERRLA, TMs pearly gray, turbinates non-edematous without discharge, post-pharynx non erythematous. Neck: Supple without lymphadenopathy. Lungs: Clear to auscultation without wheezing, rhonchi or rales. {no increased work of breathing. CV: Normal S1, S2 without murmurs. Abdomen: Nondistended, nontender. Skin: Warm and dry, without lesions or rashes. Extremities:  No clubbing, cyanosis or edema. Neuro:   Grossly intact.  Diagnositics/Labs: Labs:  Component     Latest Ref Rng & Units 02/15/2021  IgE (Immunoglobulin E), Serum     6 - 495 IU/mL 142  D Pteronyssinus IgE     Class II kU/L 0.99 (A)  D Farinae IgE     Class II kU/L 1.33 (A)  Cat Dander IgE     Class 0/I kU/L 0.10 (A)  Dog Dander IgE     Class 0 kU/L <0.10  [place] Grass IgE     Class 0 kU/L <0.10  Timothy Grass IgE  Class 0 kU/L <0.10  Johnson Grass IgE     Class 0 kU/L <0.10  Bahia Grass IgE     Class 0 kU/L <0.10  Cockroach, American IgE     Class 0 kU/L <0.10  Penicillium Chrysogen IgE     Class 0 kU/L <0.10  Cladosporium Herbarum IgE     Class 0 kU/L <0.10  Aspergillus Fumigatus IgE     Class 0 kU/L <0.10  Mucor Racemosus IgE     Class 0 kU/L <0.10  Alternaria Alternata IgE     Class 0 kU/L <0.10  Stemphylium Herbarum IgE     Class 0 kU/L <0.10  Common Silver Wendee Copp IgE     Class IV kU/L 5.93 (A)  Oak, White IgE      Class III kU/L 2.35 (A)  Elm, American IgE     Class 0/I kU/L 0.17 (A)  Maple/Box Elder IgE     Class 0/I kU/L 0.13 (A)  Hickory, White IgE     Class III kU/L 1.42 (A)  Amer Sycamore IgE Qn     Class 0 kU/L <0.10  White Mulberry IgE     Class 0 kU/L <0.10  Sweet gum IgE RAST Ql     Class III kU/L 1.67 (A)  Cedar, Mountain IgE     Class 0 kU/L <0.10  Ragweed, Short IgE     Class 0 kU/L <0.10  Mugwort IgE Qn     Class 0 kU/L <0.10  Plantain, English IgE     Class 0 kU/L <0.10  Pigweed, Rough IgE     Class 0 kU/L <0.10  Sheep Sorrel IgE Qn     Class 0 kU/L <0.10  Nettle IgE     Class 0 kU/L <0.10  WBC     3.4 - 10.8 x10E3/uL 6.3  RBC     4.14 - 5.80 x10E6/uL 5.66  Hemoglobin     13.0 - 17.7 g/dL 16.6  HCT     37.5 - 51.0 % 47.8  MCV     79 - 97 fL 85  MCH     26.6 - 33.0 pg 29.3  MCHC     31.5 - 35.7 g/dL 34.7  RDW     11.6 - 15.4 % 12.8  Neutrophils     Not Estab. % 57  Lymphs     Not Estab. % 31  Monocytes     Not Estab. % 10  Eos     Not Estab. % 1  Basos     Not Estab. % 1  NEUT#     1.4 - 7.0 x10E3/uL 3.5  Lymphocyte #     0.7 - 3.1 x10E3/uL 2.0  Monocytes Absolute     0.1 - 0.9 x10E3/uL 0.7  EOS (ABSOLUTE)     0.0 - 0.4 x10E3/uL 0.1  Basophils Absolute     0.0 - 0.2 x10E3/uL 0.0  Immature Granulocytes     Not Estab. % 0  Immature Grans (Abs)     0.0 - 0.1 x10E3/uL 0.0  Glucose     65 - 99 mg/dL 92  BUN     6 - 24 mg/dL 11  Creatinine     0.76 - 1.27 mg/dL 0.96  eGFR     >59 mL/min/1.73 101  BUN/Creatinine Ratio     9 - 20 11  Sodium     134 - 144 mmol/L 141  Potassium     3.5 - 5.2 mmol/L 4.2  Chloride     96 - 106 mmol/L 98  CO2     20 - 29 mmol/L 23  Calcium     8.7 - 10.2 mg/dL 9.5  Total Protein     6.0 - 8.5 g/dL 7.0  Albumin     4.0 - 5.0 g/dL 4.9  Globulin, Total     1.5 - 4.5 g/dL 2.1  Albumin/Globulin Ratio     1.2 - 2.2 2.3 (H)  Total Bilirubin     0.0 - 1.2 mg/dL 0.4  Alkaline Phosphatase     44 - 121 IU/L  72  AST     0 - 40 IU/L 21  ALT     0 - 44 IU/L 29  Peanut IgE     Class 0/I kU/L 0.18 (A)  Hazelnut (Filbert) IgE     Class III kU/L 2.51 (A)  [place] Nut IgE     Class 0 kU/L <0.10  F020-IgE Almond     Class 0/I kU/L 0.13 (A)  Pecan Nut IgE     Class 0 kU/L <0.10  F202-IgE Cashew Nut     Class 0 kU/L <0.10  Walnut IgE     Class 0 kU/L <0.10  Allergen Grape IgE     Class 0 kU/L <0.10  Allergen Apple, IgE     Class I kU/L 0.52 (A)  Allergen, Peach f95     Class II kU/L 0.77 (A)  Allergen Pear IgE     Class I kU/L 0.33 (A)  Allergen Banana IgE     Class 0/I kU/L 0.11 (A)  O215-IgE Alpha-Gal     Class 0 kU/L <0.10  Beef IgE     Class 0 kU/L <0.10  Pork IgE     Class 0 kU/L <0.10  Allergen Lamb IgE     Class 0 kU/L <0.10  Complement C4, Serum     12 - 38 mg/dL 36  C2 Esterase Inhibitor, Serum     21 - 39 mg/dL 38  Factor XII Activity     50 - 150 % 101  Complement C1Q     10.2 - 20.3 mg/dL 11.1  Tryptase     2.2 - 13.2 ug/L 3.8  cu index     <10 25.4 (H)  Thyroperoxidase Ab SerPl-aCnc     0 - 34 IU/mL 110 (H)  Thyroglobulin Antibody     0.0 - 0.9 IU/mL 4.0 (H)  C1 Est.Inhib.Funct.     %mean normal >100  HAE Interpretation      Comment    Assessment and plan:   Angioedema (swelling) ?  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  This is the autoimmune form of swelling and/or hives.  You also make a thyroid antibodies that is commonly seen in chronic swelling and/or hives. -Hereditary angioedema panel is normal.  This panel looks at the non-histamine driven swelling which you do not have. -Tryptase level was normal thus does not have "hyperreactive" allergy symptoms -Environmental panel shows very high IgE to tree pollen; moderate IgE to weed pollen, dust mite; very low IgE to cat dander. -Nut panel does show high  IgE level to hazelnut and low IgE to peanut and almond -Fruit panel shows lower IgE levels to apple, peach, pear and banana.  Grape IgE is now negative.  -if you have future swelling episodes recommend  taking long-acting antihistamine like Xyzal, Zyrtec or Allegra 1-2 times a day with Famotidine twice a day (Famotidine has antihistamine properties as well) -If he continues to have swelling episodes despite antihistamine regimen discussed he may be eligible for Xolair monthly injections for better control -continue your current fruit and nut avoidance -continue your current medication avoidance.  Discussed option of performing graded oral challenges in office to see if have IgE mediated allergy to the medications of question -should significant symptoms recur or new symptoms occur, a journal is to be kept recording any foods eaten, beverages consumed, medications taken, activities performed, and environmental conditions within a 6 hour time period prior to the onset of symptoms. For any symptoms concerning for anaphylaxis, epinephrine is to be administered and 911 is to be called immediately.   Follow-up in 12 months or sooner if needed  Total of 30 minutes, greater than 50% of which was spent in discussion of treatment and management options.   I appreciate the opportunity to take part in Willam's care. Please do not hesitate to contact me with questions.  Sincerely,   Prudy Feeler, MD Allergy/Immunology [facility]

## 2021-05-24 NOTE — Patient Instructions (Addendum)
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  This is the autoimmune form of swelling and/or hives.  You also make a thyroid antibodies that is commonly seen in chronic swelling and/or hives. -Hereditary angioedema panel is normal.  This panel looks at the non-histamine driven swelling which you do not have. -Tryptase level was normal thus does not have "hyperreactive" allergy symptoms -Environmental panel shows very high IgE to tree pollen; moderate IgE to weed pollen, dust mite; very low IgE to cat dander. -Nut panel does show high IgE level to hazelnut and low IgE to peanut and almond -Fruit panel shows lower IgE levels to apple, peach, pear and banana.  Grape IgE is now negative.  -if you have future swelling episodes recommend taking long-acting antihistamine like Xyzal, Zyrtec or Allegra 1-2 times a day with Famotidine twice a day (Famotidine has antihistamine properties as well) -If he continues to have swelling episodes despite antihistamine regimen discussed he may be eligible for Xolair monthly injections for better control -continue your current fruit and nut avoidance -continue your current medication avoidance.  Discussed option of performing graded oral challenges in office to see if have IgE mediated allergy to the medications of question -should significant symptoms recur or new symptoms occur, a journal is to be kept recording any foods eaten, beverages consumed, medications taken, activities performed, and environmental conditions within a 6 hour time period prior to the onset of symptoms. For any symptoms concerning for anaphylaxis, epinephrine is to be administered and 911 is to be called immediately.   Follow-up in 12 months or sooner if needed

## 2021-06-01 DIAGNOSIS — I10 Essential (primary) hypertension: Secondary | ICD-10-CM | POA: Diagnosis not present

## 2021-06-01 DIAGNOSIS — Z79899 Other long term (current) drug therapy: Secondary | ICD-10-CM | POA: Diagnosis not present

## 2021-06-01 DIAGNOSIS — E785 Hyperlipidemia, unspecified: Secondary | ICD-10-CM | POA: Diagnosis not present

## 2021-06-01 DIAGNOSIS — K219 Gastro-esophageal reflux disease without esophagitis: Secondary | ICD-10-CM | POA: Diagnosis not present

## 2021-06-08 ENCOUNTER — Other Ambulatory Visit (HOSPITAL_COMMUNITY): Payer: Self-pay | Admitting: *Deleted

## 2021-06-10 DIAGNOSIS — R739 Hyperglycemia, unspecified: Secondary | ICD-10-CM | POA: Diagnosis not present

## 2021-06-15 DIAGNOSIS — J3489 Other specified disorders of nose and nasal sinuses: Secondary | ICD-10-CM | POA: Diagnosis not present

## 2021-06-15 DIAGNOSIS — R0600 Dyspnea, unspecified: Secondary | ICD-10-CM | POA: Diagnosis not present

## 2021-06-15 DIAGNOSIS — K219 Gastro-esophageal reflux disease without esophagitis: Secondary | ICD-10-CM | POA: Diagnosis not present

## 2021-06-24 ENCOUNTER — Encounter (HOSPITAL_COMMUNITY): Payer: BC Managed Care – PPO

## 2021-06-29 DIAGNOSIS — B360 Pityriasis versicolor: Secondary | ICD-10-CM | POA: Diagnosis not present

## 2021-06-29 DIAGNOSIS — D485 Neoplasm of uncertain behavior of skin: Secondary | ICD-10-CM | POA: Diagnosis not present

## 2021-06-29 IMAGING — RF DG ESOPHAGUS
3 of 4 series · 15 of 24 positions shown · non-contrast
Comparison: None.

CLINICAL DATA: Gastroesophageal reflux, dysphagia.

EXAM:
ESOPHOGRAM / BARIUM SWALLOW / BARIUM TABLET STUDY
TECHNIQUE: Combined double contrast and single contrast examination performed
using effervescent crystals, thick barium liquid, and thin barium
liquid. The patient was observed with fluoroscopy swallowing a 13 mm
barium sulphate tablet.
FLUOROSCOPY TIME:  Fluoroscopy Time:  1 minutes 6 seconds
Radiation Exposure Index (if provided by the fluoroscopic device):
123 mGy
Number of Acquired Spot Images: 11

[Series 1: sequence · 0.23mm/px · 2 of 4 frames shown (1 of 2)]
[frame 1/4]
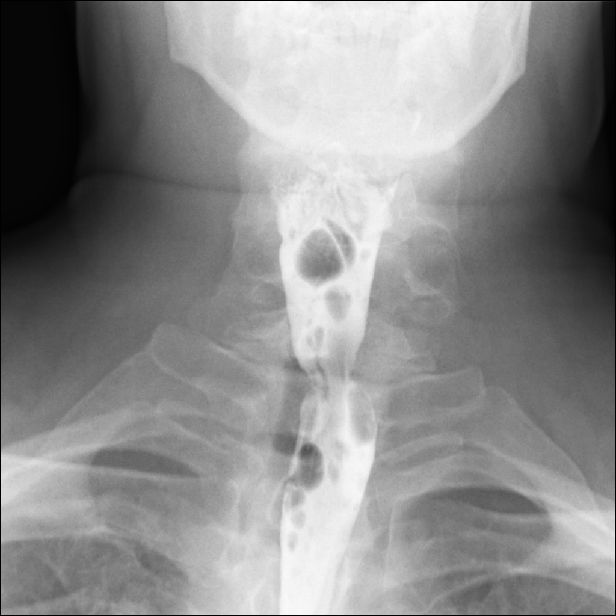
[frame 4/4]
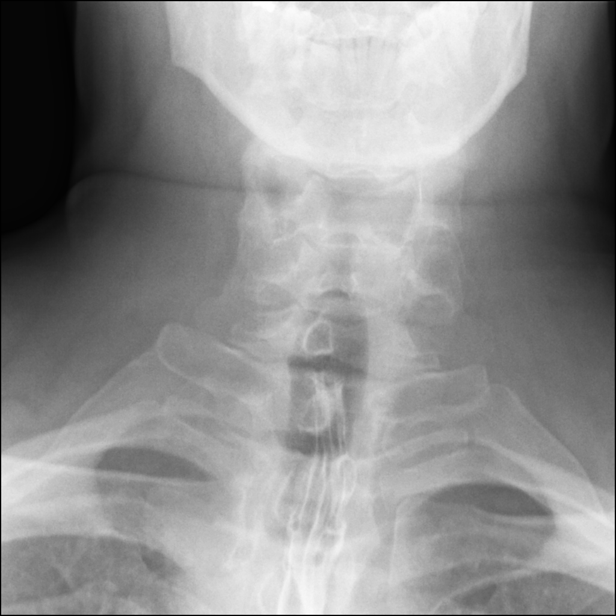

[Series 3: sequence · 0.23mm/px · 3 of 7 frames shown (2 of 2)]
[frame 2/7]
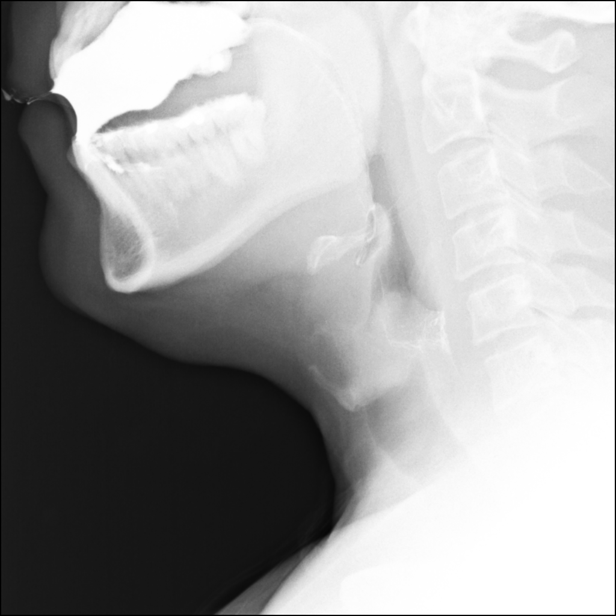
[frame 4/7]
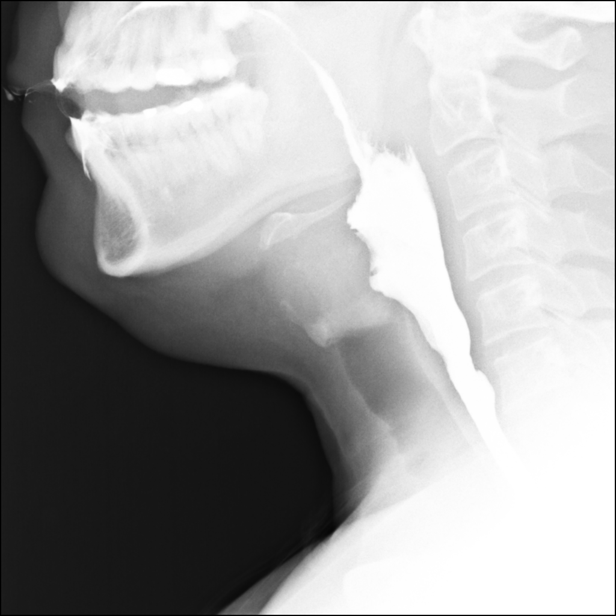
[frame 7/7]
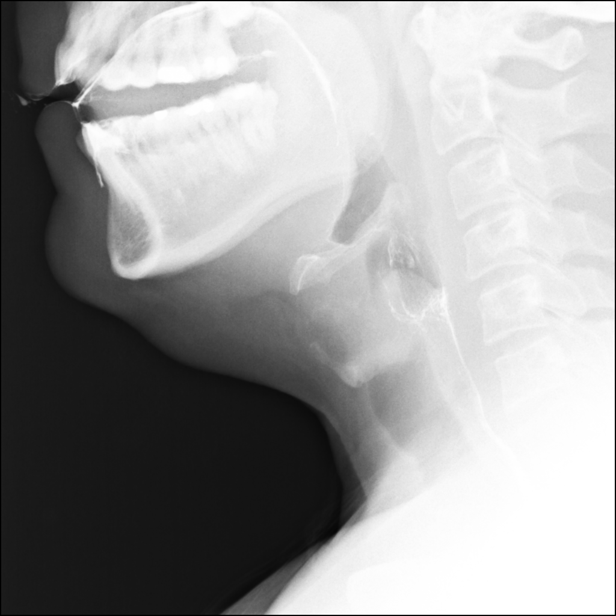

[Series 4: one shot · 10 of 16 slices shown]
[im 1/16]
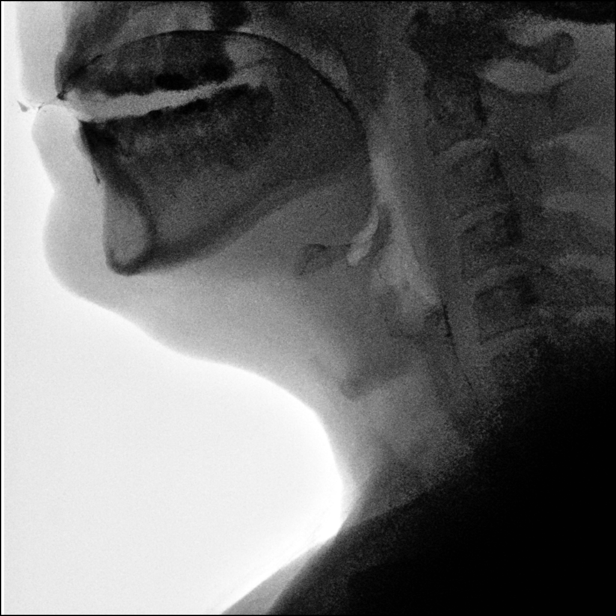
[im 3/16]
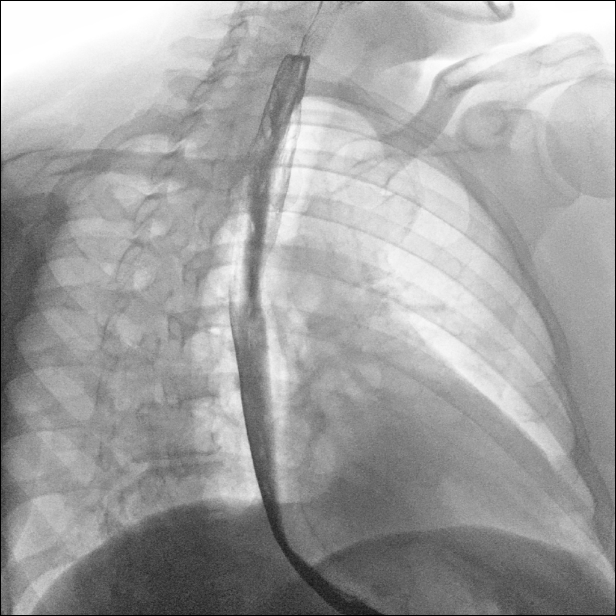
[im 5/16]
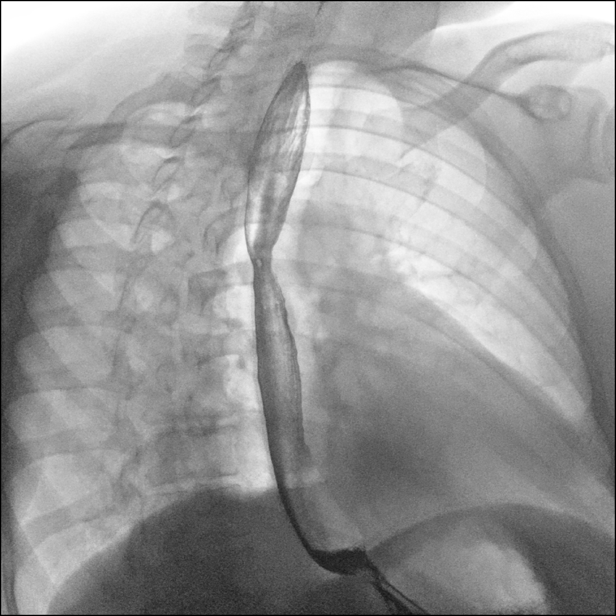
[im 6/16]
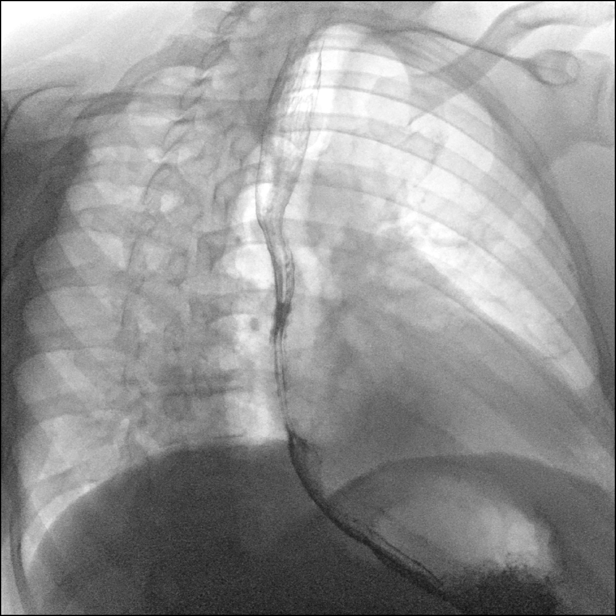
[im 8/16]
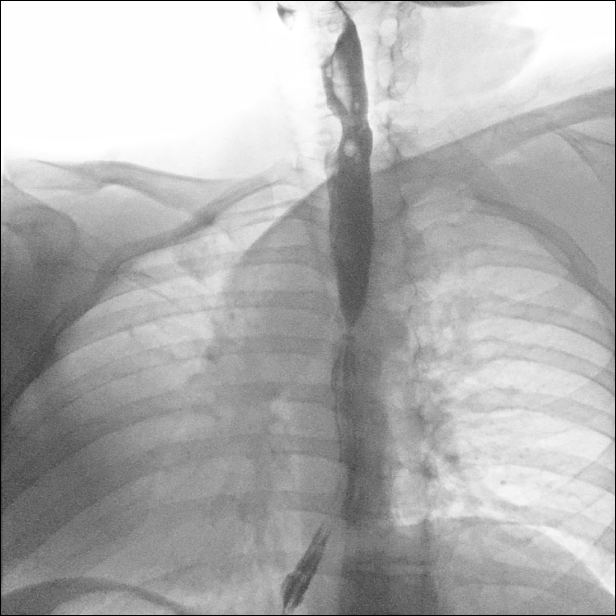
[im 9/16]
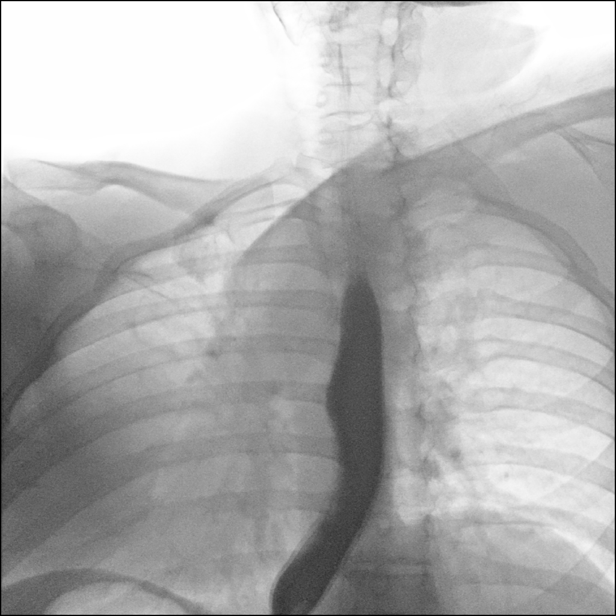
[im 11/16]
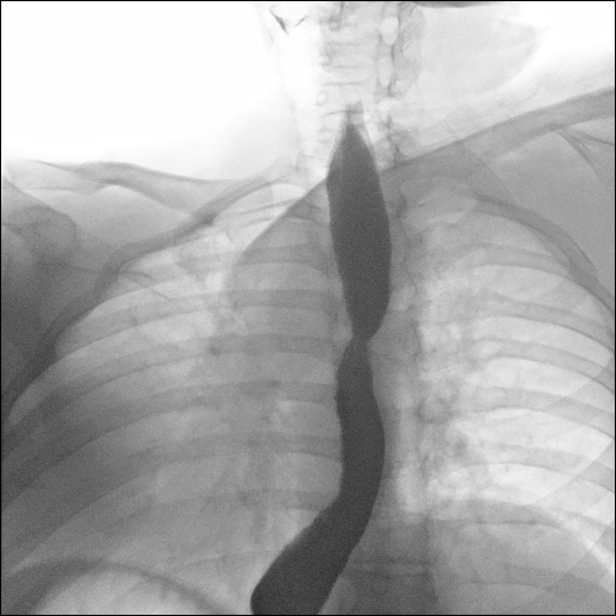
[im 13/16]
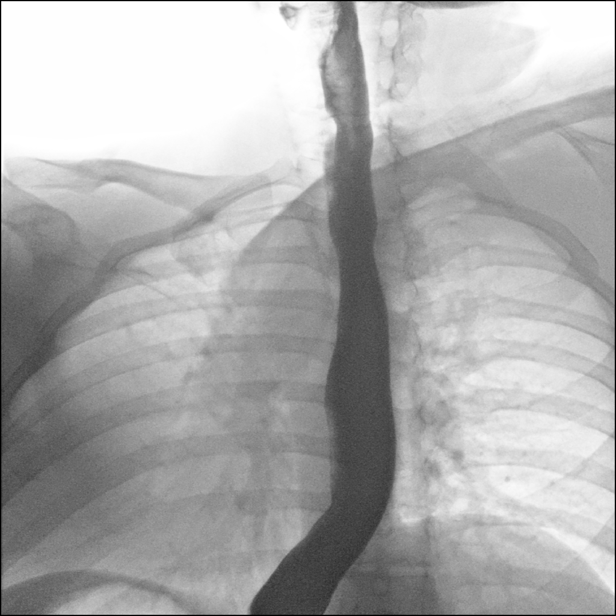
[im 14/16]
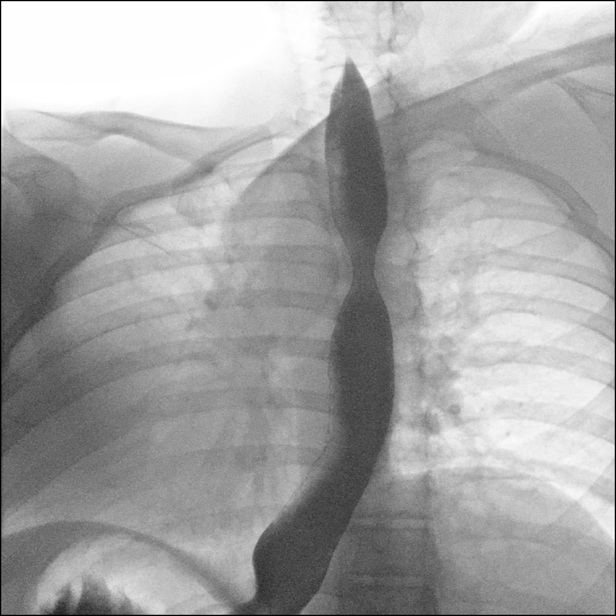
[im 16/16]
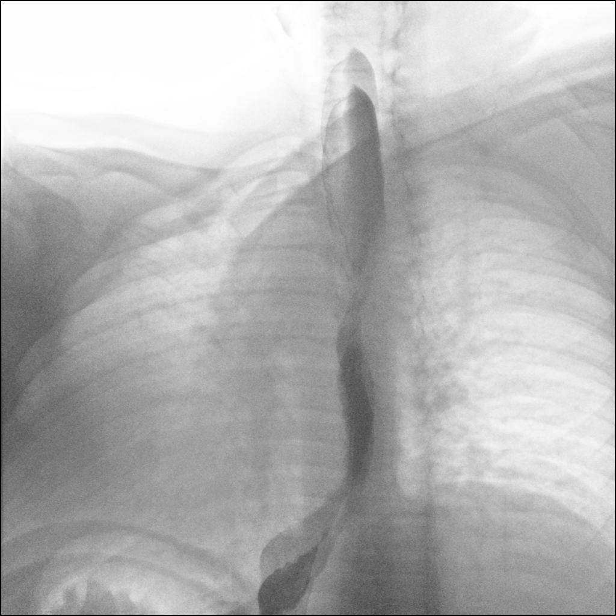

[15 of 24 positions shown; findings below may reference images not displayed]

FINDINGS: Normal swallowing mechanism. No esophageal fold thickening,
stricture or obstruction. Normal esophageal motility. A 13 mm barium
tablet passed into the stomach without difficulty.
IMPRESSION: Normal exam.

## 2021-07-13 DIAGNOSIS — K219 Gastro-esophageal reflux disease without esophagitis: Secondary | ICD-10-CM | POA: Diagnosis not present

## 2021-07-13 DIAGNOSIS — K439 Ventral hernia without obstruction or gangrene: Secondary | ICD-10-CM | POA: Diagnosis not present

## 2021-08-03 ENCOUNTER — Encounter (HOSPITAL_COMMUNITY): Payer: BC Managed Care – PPO

## 2021-08-05 ENCOUNTER — Encounter (HOSPITAL_COMMUNITY): Payer: BC Managed Care – PPO

## 2021-08-10 ENCOUNTER — Ambulatory Visit (HOSPITAL_COMMUNITY): Payer: BC Managed Care – PPO | Attending: Primary Care

## 2021-08-10 ENCOUNTER — Other Ambulatory Visit: Payer: Self-pay

## 2021-08-10 DIAGNOSIS — E785 Hyperlipidemia, unspecified: Secondary | ICD-10-CM | POA: Diagnosis not present

## 2021-08-10 DIAGNOSIS — Z87891 Personal history of nicotine dependence: Secondary | ICD-10-CM | POA: Diagnosis not present

## 2021-08-10 DIAGNOSIS — R0600 Dyspnea, unspecified: Secondary | ICD-10-CM | POA: Insufficient documentation

## 2021-08-10 DIAGNOSIS — R0609 Other forms of dyspnea: Secondary | ICD-10-CM | POA: Diagnosis not present

## 2021-08-10 DIAGNOSIS — Z79899 Other long term (current) drug therapy: Secondary | ICD-10-CM | POA: Diagnosis not present

## 2021-08-10 DIAGNOSIS — I10 Essential (primary) hypertension: Secondary | ICD-10-CM | POA: Diagnosis not present

## 2021-08-10 NOTE — Progress Notes (Signed)
Please let patient know exercise stress test demonstrated mild functional impairment when compared to matched sedentary norms. There is no clear cardiopulmonary abnormality noted, however there was a severe hypertensive response to exercise  He should either follow-up with PCP or we can refer to cardiology for hypertension management

## 2021-08-12 ENCOUNTER — Telehealth: Payer: Self-pay | Admitting: Primary Care

## 2021-08-12 ENCOUNTER — Telehealth: Payer: Self-pay | Admitting: Internal Medicine

## 2021-08-12 NOTE — Telephone Encounter (Signed)
Glenford Bayley, NP  08/10/2021  2:00 PM EDT     Please let patient know exercise stress test demonstrated mild functional impairment when compared to matched sedentary norms. There is no clear cardiopulmonary abnormality noted, however there was a severe hypertensive response to exercise   He should either follow-up with PCP or we can refer to cardiology for hypertension management    Patient is aware of results and voiced his understanding.  He stated that he would contact PCP.  Nothing further needed at this time.

## 2021-08-12 NOTE — Telephone Encounter (Signed)
disregard

## 2021-08-20 NOTE — Telephone Encounter (Signed)
Created in error

## 2021-08-25 ENCOUNTER — Encounter: Payer: Self-pay | Admitting: *Deleted

## 2021-11-22 ENCOUNTER — Other Ambulatory Visit: Payer: Self-pay | Admitting: Cardiology

## 2021-11-22 DIAGNOSIS — R072 Precordial pain: Secondary | ICD-10-CM

## 2021-11-30 ENCOUNTER — Ambulatory Visit: Payer: BC Managed Care – PPO

## 2021-12-02 DIAGNOSIS — K219 Gastro-esophageal reflux disease without esophagitis: Secondary | ICD-10-CM | POA: Diagnosis not present

## 2021-12-02 DIAGNOSIS — I10 Essential (primary) hypertension: Secondary | ICD-10-CM | POA: Diagnosis not present

## 2021-12-02 DIAGNOSIS — E785 Hyperlipidemia, unspecified: Secondary | ICD-10-CM | POA: Diagnosis not present

## 2021-12-02 DIAGNOSIS — Z79899 Other long term (current) drug therapy: Secondary | ICD-10-CM | POA: Diagnosis not present

## 2021-12-02 DIAGNOSIS — R739 Hyperglycemia, unspecified: Secondary | ICD-10-CM | POA: Diagnosis not present

## 2021-12-15 ENCOUNTER — Ambulatory Visit: Payer: BC Managed Care – PPO | Admitting: Cardiology

## 2021-12-15 ENCOUNTER — Encounter: Payer: Self-pay | Admitting: Cardiology

## 2021-12-15 ENCOUNTER — Other Ambulatory Visit: Payer: Self-pay

## 2021-12-15 VITALS — BP 148/96 | HR 68 | Ht 69.0 in | Wt 240.0 lb

## 2021-12-15 DIAGNOSIS — I2510 Atherosclerotic heart disease of native coronary artery without angina pectoris: Secondary | ICD-10-CM

## 2021-12-15 DIAGNOSIS — R0609 Other forms of dyspnea: Secondary | ICD-10-CM | POA: Diagnosis not present

## 2021-12-15 DIAGNOSIS — I10 Essential (primary) hypertension: Secondary | ICD-10-CM

## 2021-12-15 DIAGNOSIS — Z79899 Other long term (current) drug therapy: Secondary | ICD-10-CM | POA: Diagnosis not present

## 2021-12-15 DIAGNOSIS — E7800 Pure hypercholesterolemia, unspecified: Secondary | ICD-10-CM | POA: Diagnosis not present

## 2021-12-15 DIAGNOSIS — R072 Precordial pain: Secondary | ICD-10-CM

## 2021-12-15 DIAGNOSIS — R931 Abnormal findings on diagnostic imaging of heart and coronary circulation: Secondary | ICD-10-CM

## 2021-12-15 MED ORDER — HYDROCHLOROTHIAZIDE 25 MG PO TABS
25.0000 mg | ORAL_TABLET | Freq: Every day | ORAL | 3 refills | Status: DC
Start: 2021-12-15 — End: 2022-09-20

## 2021-12-15 NOTE — Progress Notes (Signed)
Cardiology Office Note:    Date:  12/15/2021   ID:  Matthew Navarro, DOB [birth year], MRN [id number]  PCP:  Janine Limbo, PA-C  [facility] Cardiologist:  None  [facility] Electrophysiologist:  None   Referring MD: Janine Limbo, PA-C    History of Present Illness:    Matthew Navarro is a 46 y.o. male with a hx of HTN, HLD, and angioedema who presents to clinic for follow-up.  Patient was initially seen in 10/2020 for significant dyspnea on exertion. Coronary CTA with mild nonobstructive CAD with Ca score 18.6 (86%). TTE with normal BiV function and no significant valve disease. We referred to Pulm where 05/21/2021 PFTs- FVC 5.17 (102%), FEV1 4.33 (108%), ratio 84, TLC 90%, DLCO 36.33 (122%). Normal spirometry without bronchodilator response.  Normal diffusion capacity. HRCT 04/21/2021-no evidence of interstitial lung disease, no acute findings in thorax.  Aortic arthrosclerosis, in addition to left anterior descending coronary artery disease.  Patient overall feels much better than his prior visit. No significant SOB, chest pain, lightheadedness, or syncope. No LE edema, orthopnea or PND. Currently, just taking diltiazem 180 for blood pressure. He has started exercising and has successfully lost weight. Trying to adjust his diet as well.    Prior smoker x 10 years. Quit several years ago (used to smoke 1-2packs per week)  Family history: Paternal GF: CVA, Paternal GM: CAD, Dad with MI (age 46; smoker and drinker).   Past Medical History:  Diagnosis Date   Angioedema 09/21/2015   BMI 36.0-36.9,adult    DOE (dyspnea on exertion)    after a fall   HTN (hypertension)    Hyperlipidemia    Hypertension     No past surgical history on file.  Current Medications: Current Meds  Medication Sig   atorvastatin (LIPITOR) 20 MG tablet Take 20 mg by mouth daily.   diltiazem (CARDIZEM CD) 180 MG 24 hr capsule Take 180 mg by mouth daily.   EPINEPHrine 0.3 mg/0.3 mL IJ SOAJ injection  Inject 0.3 mLs (0.3 mg total) into the muscle once.   famotidine (PEPCID) 20 MG tablet Take 1 tablet (20 mg total) by mouth 2 (two) times daily.   hydrochlorothiazide (HYDRODIURIL) 25 MG tablet Take 1 tablet (25 mg total) by mouth daily.   hydrOXYzine (ATARAX/VISTARIL) 25 MG tablet Take 0.5-1 tablets (12.5-25 mg total) by mouth every 8 (eight) hours as needed for itching.   Multiple Vitamin (MULTIVITAMIN) capsule Take 1 capsule by mouth daily.   [DISCONTINUED] carvedilol (COREG CR) 20 MG 24 hr capsule Take 20 mg by mouth every morning.     Allergies:   Amlodipine, Amlodipine-olmesartan, and Pantoprazole   Social History   Socioeconomic History   Marital status: Married    Spouse name: Not on file   Number of children: Not on file   Years of education: Not on file   Highest education level: Not on file  Occupational History   Not on file  Tobacco Use   Smoking status: Former    Packs/day: 0.08    Years: 8.00    Pack years: 0.64    Types: Cigarettes    Start date: 50    Quit date: 11/01/2011    Years since quitting: 10.1   Smokeless tobacco: Never  Vaping Use   Vaping Use: Never used  Substance and Sexual Activity   Alcohol use: Yes    Alcohol/week: 10.0 standard drinks    Types: 10 Standard drinks or equivalent per week    Comment: wine or  beer   Drug use: No   Sexual activity: Not on file  Other Topics Concern   Not on file  Social History Narrative   Not on file   Social Determinants of Health   Financial Resource Strain: Not on file  Food Insecurity: Not on file  Transportation Needs: Not on file  Physical Activity: Not on file  Stress: Not on file  Social Connections: Not on file     Family History: The patient's family history includes Breast cancer in his paternal grandmother; CVA in his paternal grandfather; Diabetes in his paternal uncle; Heart attack (age of onset: 67) in his father; Parkinson's disease in his paternal grandmother. There is no history of  Allergic rhinitis, Angioedema, Asthma, Eczema, Immunodeficiency, or Urticaria.  ROS:   Please see the history of present illness.    Review of Systems  Constitutional:  Negative for chills and fever.  HENT:  Negative for nosebleeds.   Eyes:  Negative for blurred vision.  Respiratory:  Positive for shortness of breath.   Cardiovascular:  Positive for chest pain. Negative for palpitations, orthopnea, claudication, leg swelling and PND.  Gastrointestinal:  Negative for nausea and vomiting.  Genitourinary:  Negative for hematuria.  Musculoskeletal:  Negative for falls.  Neurological:  Negative for dizziness and loss of consciousness.  Endo/Heme/Allergies:  Negative for polydipsia.  Psychiatric/Behavioral:  Negative for substance abuse.    EKGs/Labs/Other Studies Reviewed:    The following studies were reviewed today: Coronary CTA 12/2020: FINDINGS: Non-cardiac: See separate report from [facility].   Pulmonary veins drain normally to the left atrium. No LA appendage thrombus.   Calcium Score: 18.6 Agatston units.   Coronary Arteries: Right dominant with no anomalies   LM: No plaque or stenosis.   LAD system: Mixed plaque proximal LAD, mild (<50%) stenosis.   Circumflex system: No plaque or stenosis.   RCA system: No plaque or stenosis.   IMPRESSION: 1.  Nonobstructive CAD.   2. Coronary artery calcium score 18.6 Agatston units. This places the patient in the 86th percentile for age and gender.  TTE 12/21/20: IMPRESSIONS     1. Left ventricular ejection fraction, by estimation, is 60 to 65%. The  left ventricle has normal function. The left ventricle has no regional  wall motion abnormalities. Left ventricular diastolic parameters were  normal.   2. Right ventricular systolic function is normal. The right ventricular  size is normal. Tricuspid regurgitation signal is inadequate for assessing  PA pressure.   3. The mitral valve is grossly normal. No evidence  of mitral valve  regurgitation. No evidence of mitral stenosis.   4. The aortic valve is tricuspid. Aortic valve regurgitation is not  visualized. No aortic stenosis is present.   5. The inferior vena cava is normal in size with greater than 50%  respiratory variability, suggesting right atrial pressure of 3 mmHg.   EKG:  EKG is  ordered today.  The ekg ordered today demonstrates NSR with HR 73  Recent Labs: 02/15/2021: ALT 29; BUN 11; Creatinine, Ser 0.96; Hemoglobin 16.6; Potassium 4.2; Sodium 141  Recent Lipid Panel No results found for: CHOL, TRIG, HDL, CHOLHDL, VLDL, LDLCALC, LDLDIRECT   Physical Exam:    VS:  BP (!) 148/96    Pulse 68    Ht 5\' 9"  (1.753 m)    Wt 240 lb (108.9 kg)    SpO2 98%    BMI 35.44 kg/m     Wt Readings from Last 3 Encounters:  12/15/21 240  lb (108.9 kg)  05/21/21 239 lb 3.2 oz (108.5 kg)  04/13/21 242 lb (109.8 kg)     GEN:  Well nourished, well developed in no acute distress HEENT: Normal NECK: No JVD; No carotid bruits CARDIAC: RRR, no murmurs, rubs, gallops RESPIRATORY:  Clear to auscultation without rales, wheezing or rhonchi  ABDOMEN: Soft, non-tender, non-distended MUSCULOSKELETAL:  No edema; No deformity  SKIN: Warm and dry NEUROLOGIC:  Alert and oriented x 3 PSYCHIATRIC:  Normal affect   ASSESSMENT:    1. Medication management   2. Primary hypertension   3. Pure hypercholesterolemia   4. Dyspnea on exertion   5. Precordial pain   6. Elevated coronary artery calcium score   7. Coronary artery disease involving native coronary artery of native heart without angina pectoris    PLAN:    In order of problems listed above:  #Dyspnea on Exertion: #Chest Pain: Coronary CTA without obstructive disease. TTE with normal BiV function. PFTs normal with pulm. CPET with severe hypertensive response to exercise. Mild chronotropic incompetence. Suspect symptoms may be related to severely elevated blood pressures with exercise. Will manage HTN as  below. -Coronary CTA with mild disease -TTE with normal BiV function, no significant valve disease -Manage HTN as below  #CAD: Ca score 18 which is 86%. -Continue lipitor 20mg  daily -Will get lipids from PCP office -Goal LDL<70  #HTN: Elevated today wit CPET revealing severe HTN with exercise. -Continue dilt 180mg  daily -Start HCTZ 25mg  daily -BMET next week -Keep BP log and send in results -Has angioedema with ACE/ARBs  #HLD: Managed by PCP. -Continue atorvastatin 20mg  daily -Follow-up lipids per PCP office  Medication Adjustments/Labs and Tests Ordered: Current medicines are reviewed at length with the patient today.  Concerns regarding medicines are outlined above.  Orders Placed This Encounter  Procedures   Basic metabolic panel   EKG [id number]   Meds ordered this encounter  Medications   hydrochlorothiazide (HYDRODIURIL) 25 MG tablet    Sig: Take 1 tablet (25 mg total) by mouth daily.    Dispense:  90 tablet    Refill:  3    DOSE INCREASE    Patient Instructions  Medication Instructions:   STOP TAKING CARVEDILOL (COREG) NOW  START TAKING HYDROCHLOROTHIAZIDE 25 MG BY MOUTH DAILY  *If you need a refill on your cardiac medications before your next appointment, please call your pharmacy*   Lab Work:  [place] OFFICE--BMET  If you have labs (blood work) drawn today and your tests are completely normal, you will receive your results only by: [place] (if you have MyChart) OR A paper copy in the mail If you have any lab test that is abnormal or we need to change your treatment, we will call you to review the results.   Follow-Up: At [facility], you and your health needs are our priority.  As part of our continuing mission to provide you with exceptional heart care, we have created designated Provider Care Teams.  These Care Teams include your primary Cardiologist (physician) and Advanced Practice Providers (APPs -  Physician Assistants  and Nurse Practitioners) who all work together to provide you with the care you need, when you need it.  We recommend signing up for the patient portal called "MyChart".  Sign up information is provided on this After Visit Summary.  MyChart is used to connect with patients for Virtual Visits (Telemedicine).  Patients are able to view lab/test results, encounter notes, upcoming appointments, etc.  Non-urgent messages can be sent to your provider as well.   To learn more about what you can do with MyChart, go to [email or web address].    Your next appointment:   1 year(s)  The format for your next appointment:   In Person  Provider:   DR. Johney Frame   Other Instructions  PLEASE SEND [place] A LOG OF YOUR BLOOD PRESSURE RECORDINGS VIA MYCHART  PLEASE HAVE YOUR PCP FAX [place] A COPY OF YOUR RECENT LIPID RESULTS TO [phone] ATTN: DR. Johney Frame     Signed, Freada Bergeron, MD  12/15/2021 2:17 PM    [facility]

## 2021-12-15 NOTE — Patient Instructions (Signed)
Medication Instructions:   STOP TAKING CARVEDILOL (COREG) NOW  START TAKING HYDROCHLOROTHIAZIDE 25 MG BY MOUTH DAILY  *If you need a refill on your cardiac medications before your next appointment, please call your pharmacy*   Lab Work:  ONE WEEK HERE IN THE OFFICE--BMET  If you have labs (blood work) drawn today and your tests are completely normal, you will receive your results only by: MyChart Message (if you have MyChart) OR A paper copy in the mail If you have any lab test that is abnormal or we need to change your treatment, we will call you to review the results.   Follow-Up: At [facility], you and your health needs are our priority.  As part of our continuing mission to provide you with exceptional heart care, we have created designated Provider Care Teams.  These Care Teams include your primary Cardiologist (physician) and Advanced Practice Providers (APPs -  Physician Assistants and Nurse Practitioners) who all work together to provide you with the care you need, when you need it.  We recommend signing up for the patient portal called "MyChart".  Sign up information is provided on this After Visit Summary.  MyChart is used to connect with patients for Virtual Visits (Telemedicine).  Patients are able to view lab/test results, encounter notes, upcoming appointments, etc.  Non-urgent messages can be sent to your provider as well.   To learn more about what you can do with MyChart, go to [email or web address].    Your next appointment:   1 year(s)  The format for your next appointment:   In Person  Provider:   DR. Shari Prows   Other Instructions  PLEASE SEND [place] A LOG OF YOUR BLOOD PRESSURE RECORDINGS VIA MYCHART  PLEASE HAVE YOUR PCP FAX [place] A COPY OF YOUR RECENT LIPID RESULTS TO [phone] ATTN: DR. Shari Prows

## 2021-12-21 ENCOUNTER — Other Ambulatory Visit: Payer: BC Managed Care – PPO | Admitting: *Deleted

## 2021-12-21 ENCOUNTER — Other Ambulatory Visit: Payer: Self-pay

## 2021-12-21 DIAGNOSIS — E7800 Pure hypercholesterolemia, unspecified: Secondary | ICD-10-CM

## 2021-12-21 DIAGNOSIS — Z79899 Other long term (current) drug therapy: Secondary | ICD-10-CM

## 2021-12-21 DIAGNOSIS — I10 Essential (primary) hypertension: Secondary | ICD-10-CM | POA: Diagnosis not present

## 2021-12-21 LAB — BASIC METABOLIC PANEL
BUN/Creatinine Ratio: 15 (ref 9–20)
BUN: 14 mg/dL (ref 6–24)
CO2: 28 mmol/L (ref 20–29)
Calcium: 9.8 mg/dL (ref 8.7–10.2)
Chloride: 97 mmol/L (ref 96–106)
Creatinine, Ser: 0.93 mg/dL (ref 0.76–1.27)
Glucose: 111 mg/dL — ABNORMAL HIGH (ref 70–99)
Potassium: 3.7 mmol/L (ref 3.5–5.2)
Sodium: 140 mmol/L (ref 134–144)
eGFR: 104 mL/min/{1.73_m2} (ref >59)

## 2021-12-23 ENCOUNTER — Encounter: Payer: Self-pay | Admitting: Cardiology

## 2021-12-23 DIAGNOSIS — I10 Essential (primary) hypertension: Secondary | ICD-10-CM

## 2021-12-23 DIAGNOSIS — Z0189 Encounter for other specified special examinations: Secondary | ICD-10-CM

## 2021-12-23 DIAGNOSIS — Z79899 Other long term (current) drug therapy: Secondary | ICD-10-CM

## 2021-12-23 MED ORDER — SPIRONOLACTONE 25 MG PO TABS: 25.0000 mg | ORAL_TABLET | Freq: Every day | ORAL | 3 refills | Status: DC

## 2021-12-23 NOTE — Telephone Encounter (Signed)
Meriam Sprague, MD  Loa Socks, LPN Thank you so much for forwarding to me. He is still too high. Due to his allergy to amlodipine and olmesartan, will do trial of spironolactone 25mg  daily. Can we repeat BMET in 7-10 days after starting the medication?   Thank you!!!   -Heather           Pt made aware of recommendations, as mentioned above per Dr. 9-10. Pt aware to start taking spironolactone 25 mg po daily and come in for repeat BMET in 7-10 days.   Confirmed the pharmacy of choice with the pt.  Pt request to have his lab done on 01/04/22, for he is off work that day.  Lab appt to recheck BMET scheduled for 3/7. Pt verbalized understanding and agrees with this plan.

## 2021-12-27 ENCOUNTER — Other Ambulatory Visit: Payer: Self-pay | Admitting: Cardiology

## 2021-12-27 DIAGNOSIS — E7800 Pure hypercholesterolemia, unspecified: Secondary | ICD-10-CM

## 2021-12-28 ENCOUNTER — Ambulatory Visit: Payer: BC Managed Care – PPO | Admitting: Cardiology

## 2022-01-04 ENCOUNTER — Other Ambulatory Visit: Payer: BC Managed Care – PPO | Admitting: *Deleted

## 2022-01-04 ENCOUNTER — Other Ambulatory Visit: Payer: Self-pay

## 2022-01-04 DIAGNOSIS — I10 Essential (primary) hypertension: Secondary | ICD-10-CM | POA: Diagnosis not present

## 2022-01-04 DIAGNOSIS — Z0189 Encounter for other specified special examinations: Secondary | ICD-10-CM

## 2022-01-04 DIAGNOSIS — Z79899 Other long term (current) drug therapy: Secondary | ICD-10-CM

## 2022-01-04 LAB — BASIC METABOLIC PANEL
BUN/Creatinine Ratio: 22 — ABNORMAL HIGH (ref 9–20)
BUN: 19 mg/dL (ref 6–24)
CO2: 25 mmol/L (ref 20–29)
Calcium: 9.6 mg/dL (ref 8.7–10.2)
Chloride: 96 mmol/L (ref 96–106)
Creatinine, Ser: 0.86 mg/dL (ref 0.76–1.27)
Glucose: 106 mg/dL — ABNORMAL HIGH (ref 70–99)
Potassium: 4.1 mmol/L (ref 3.5–5.2)
Sodium: 135 mmol/L (ref 134–144)
eGFR: 109 mL/min/{1.73_m2} (ref >59)

## 2022-01-31 ENCOUNTER — Ambulatory Visit: Payer: BC Managed Care – PPO | Admitting: Cardiology

## 2022-02-01 ENCOUNTER — Ambulatory Visit: Payer: BC Managed Care – PPO | Admitting: Cardiology

## 2022-04-15 ENCOUNTER — Other Ambulatory Visit: Payer: Self-pay

## 2022-04-15 DIAGNOSIS — Z79899 Other long term (current) drug therapy: Secondary | ICD-10-CM

## 2022-04-15 DIAGNOSIS — I10 Essential (primary) hypertension: Secondary | ICD-10-CM

## 2022-04-15 DIAGNOSIS — Z0189 Encounter for other specified special examinations: Secondary | ICD-10-CM

## 2022-04-15 MED ORDER — SPIRONOLACTONE 25 MG PO TABS: 25.0000 mg | ORAL_TABLET | Freq: Every day | ORAL | 2 refills | Status: DC

## 2022-06-07 DIAGNOSIS — E785 Hyperlipidemia, unspecified: Secondary | ICD-10-CM | POA: Diagnosis not present

## 2022-06-07 DIAGNOSIS — Z6835 Body mass index (BMI) 35.0-35.9, adult: Secondary | ICD-10-CM | POA: Diagnosis not present

## 2022-06-07 DIAGNOSIS — I10 Essential (primary) hypertension: Secondary | ICD-10-CM | POA: Diagnosis not present

## 2022-06-07 DIAGNOSIS — R739 Hyperglycemia, unspecified: Secondary | ICD-10-CM | POA: Diagnosis not present

## 2022-06-07 DIAGNOSIS — Z79899 Other long term (current) drug therapy: Secondary | ICD-10-CM | POA: Diagnosis not present

## 2022-08-25 ENCOUNTER — Ambulatory Visit
Admission: RE | Admit: 2022-08-25 | Discharge: 2022-08-25 | Disposition: A | Discharge status: Home / Self Care | Payer: BC Managed Care – PPO | Source: Ambulatory Visit | Attending: Internal Medicine | Admitting: Internal Medicine

## 2022-08-25 ENCOUNTER — Other Ambulatory Visit: Payer: Self-pay

## 2022-08-25 ENCOUNTER — Ambulatory Visit (INDEPENDENT_AMBULATORY_CARE_PROVIDER_SITE_OTHER): Payer: BC Managed Care – PPO

## 2022-08-25 VITALS — BP 155/107 | HR 76 | Temp 98.2°F | Resp 18

## 2022-08-25 DIAGNOSIS — J069 Acute upper respiratory infection, unspecified: Secondary | ICD-10-CM | POA: Diagnosis not present

## 2022-08-25 DIAGNOSIS — R059 Cough, unspecified: Secondary | ICD-10-CM

## 2022-08-25 MED ORDER — BENZONATATE 100 MG PO CAPS: 100.0000 mg | ORAL_CAPSULE | Freq: Three times a day (TID) | ORAL | 0 refills | Status: DC | PRN

## 2022-08-25 MED ORDER — PREDNISONE 20 MG PO TABS: 40.0000 mg | ORAL_TABLET | Freq: Every day | ORAL | 0 refills | Status: AC

## 2022-08-25 NOTE — ED Provider Notes (Signed)
[facility]    CSN: [id number] Arrival date & time: 08/25/22  1456      History   Chief Complaint Chief Complaint  Patient presents with   Sore Throat    I'm dealing with a cold/congestion, but today it seems like my uvula is hanging low into my throat. - Entered by patient    HPI Matthew Navarro is a 46 y.o. male.   Patient presents with nasal congestion and cough that has been present for about a week.  Patient reports that his cough is productive and he feels lots of "chest congestion".  Symptoms started off with a sore throat which is now resolved.  Patient does report that he had some left ear pressure as well that is now minimal.  Denies ear pain.  He is also concerned given that he feels like his uvula is "hanging lower" that started today.  Although he denies any feelings of throat closing or throat swelling.  Has taken several over-the-counter cold and flu medications with minimal improvement of symptoms.  Denies chest pain, shortness of breath, nausea, vomiting, diarrhea, abdominal pain.  Denies any formal diagnosis of asthma or COPD.  Patient also has elevated blood pressure reading and reports that he has been taking his blood pressure medication as prescribed.  He is followed by cardiology for hypertension management.  Denies chest pain, shortness of breath, headache, dizziness, blurred vision, nausea, vomiting.   Sore Throat    Past Medical History:  Diagnosis Date   Angioedema 09/21/2015   BMI 36.0-36.9,adult    DOE (dyspnea on exertion)    after a fall   HTN (hypertension)    Hyperlipidemia    Hypertension     Patient Active Problem List   Diagnosis Date Noted   Hypertension 05/21/2021   Dyspnea on exertion 05/21/2021   Seasonal allergies 05/21/2021   GERD (gastroesophageal reflux disease) 05/21/2021   Angioedema 09/21/2015   Allergic reaction 09/21/2015    History reviewed. No pertinent surgical history.     Home Medications     Prior to Admission medications   Medication Sig Start Date End Date Taking? Authorizing Provider  benzonatate (TESSALON) 100 MG capsule Take 1 capsule (100 mg total) by mouth every 8 (eight) hours as needed for cough. 08/25/22  Yes Dejan Angert, Rolly Salter E, FNP  predniSONE (DELTASONE) 20 MG tablet Take 2 tablets (40 mg total) by mouth daily for 5 days. 08/25/22 08/30/22 Yes Balbina Depace, Acie Fredrickson, FNP  atorvastatin (LIPITOR) 20 MG tablet Take 20 mg by mouth daily.    [provider]  diltiazem (CARDIZEM CD) 180 MG 24 hr capsule Take 180 mg by mouth daily.    [provider]  EPINEPHrine 0.3 mg/0.3 mL IJ SOAJ injection Inject 0.3 mLs (0.3 mg total) into the muscle once. 09/21/15   Kozlow, Alvira Philips, MD  famotidine (PEPCID) 20 MG tablet Take 1 tablet (20 mg total) by mouth 2 (two) times daily. 01/29/21   Wallis Bamberg, PA-C  hydrochlorothiazide (HYDRODIURIL) 25 MG tablet Take 1 tablet (25 mg total) by mouth daily. 12/15/21   Meriam Sprague, MD  hydrOXYzine (ATARAX/VISTARIL) 25 MG tablet Take 0.5-1 tablets (12.5-25 mg total) by mouth every 8 (eight) hours as needed for itching. 01/29/21   Wallis Bamberg, PA-C  Multiple Vitamin (MULTIVITAMIN) capsule Take 1 capsule by mouth daily.    [provider]  spironolactone (ALDACTONE) 25 MG tablet Take 1 tablet (25 mg total) by mouth daily. 04/15/22   Meriam Sprague, MD  Family History Family History  Problem Relation Age of Onset   Heart attack Father 10   Diabetes Paternal Uncle    Parkinson's disease Paternal Grandmother    Breast cancer Paternal Grandmother    CVA Paternal Grandfather    Allergic rhinitis Neg Hx    Angioedema Neg Hx    Asthma Neg Hx    Eczema Neg Hx    Immunodeficiency Neg Hx    Urticaria Neg Hx     Social History Social History   Tobacco Use   Smoking status: Former    Packs/day: 0.08    Years: 8.00    Total pack years: 0.64    Types: Cigarettes    Start date: 26    Quit date: 11/01/2011    Years since  quitting: 10.8   Smokeless tobacco: Never  Vaping Use   Vaping Use: Never used  Substance Use Topics   Alcohol use: Yes    Alcohol/week: 10.0 standard drinks of alcohol    Types: 10 Standard drinks or equivalent per week    Comment: wine or beer   Drug use: No     Allergies   Amlodipine, Amlodipine-olmesartan, and Pantoprazole   Review of Systems Review of Systems Per HPI  Physical Exam Triage Vital Signs ED Triage Vitals  Enc Vitals Group     BP 08/25/22 1521 (!) 161/116     Pulse Rate 08/25/22 1521 76     Resp 08/25/22 1521 18     Temp 08/25/22 1521 98.2 F (36.8 C)     Temp Source 08/25/22 1521 Oral     SpO2 08/25/22 1521 97 %     Weight --      Height --      Head Circumference --      Peak Flow --      Pain Score 08/25/22 1522 0     Pain Loc --      Pain Edu? --      Excl. in [place]? --    No data found.  Updated Vital Signs BP (!) 155/107   Pulse 76   Temp 98.2 F (36.8 C) (Oral)   Resp 18   SpO2 97%   Visual Acuity Right Eye Distance:   Left Eye Distance:   Bilateral Distance:    Right Eye Near:   Left Eye Near:    Bilateral Near:     Physical Exam Constitutional:      General: He is not in acute distress.    Appearance: Normal appearance. He is not toxic-appearing or diaphoretic.  HENT:     Head: Normocephalic and atraumatic.     Right Ear: Tympanic membrane and ear canal normal.     Left Ear: Tympanic membrane and ear canal normal.     Nose: Congestion present.     Mouth/Throat:     Mouth: Mucous membranes are moist.     Pharynx: Posterior oropharyngeal erythema present. No pharyngeal swelling, oropharyngeal exudate or uvula swelling.     Tonsils: No tonsillar exudate or tonsillar abscesses.  Eyes:     Extraocular Movements: Extraocular movements intact.     Conjunctiva/sclera: Conjunctivae normal.     Pupils: Pupils are equal, round, and reactive to light.  Cardiovascular:     Rate and Rhythm: Normal rate and regular rhythm.      Pulses: Normal pulses.     Heart sounds: Normal heart sounds.  Pulmonary:     Effort: Pulmonary effort is normal. No respiratory distress.  Breath sounds: No stridor. Rhonchi present. No wheezing or rales.  Abdominal:     General: Abdomen is flat. Bowel sounds are normal.     Palpations: Abdomen is soft.  Musculoskeletal:        General: Normal range of motion.     Cervical back: Normal range of motion.  Skin:    General: Skin is warm and dry.  Neurological:     General: No focal deficit present.     Mental Status: He is alert and oriented to person, place, and time. Mental status is at baseline.     Cranial Nerves: Cranial nerves 2-12 are intact.     Sensory: Sensation is intact.     Motor: Motor function is intact.     Coordination: Coordination is intact.     Gait: Gait is intact.  Psychiatric:        Mood and Affect: Mood normal.        Behavior: Behavior normal.      UC Treatments / Results  Labs (all labs ordered are listed, but only abnormal results are displayed) Labs Reviewed - No data to display  EKG   Radiology DG Chest 2 View  Result Date: 08/25/2022 CLINICAL DATA:  Cough EXAM: CHEST - 2 VIEW COMPARISON:  CT chest done on 04/20/2021 FINDINGS: The heart size and mediastinal contours are within normal limits. Both lungs are clear. The visualized skeletal structures are unremarkable. IMPRESSION: No active cardiopulmonary disease. Electronically Signed   By: Ernie Avena M.D.   On: 08/25/2022 15:44    Procedures Procedures (including critical care time)  Medications Ordered in UC Medications - No data to display  Initial Impression / Assessment and Plan / UC Course  I have reviewed the triage vital signs and the nursing notes.  Pertinent labs & imaging results that were available during my care of the patient were reviewed by me and considered in my medical decision making (see chart for details).     Patient presents with symptoms likely from  a viral upper respiratory infection. Differential includes bacterial pneumonia, sinusitis, allergic rhinitis, COVID-19, flu, RSV.  Chest x-ray completed given rhonchus breath sounds.  Chest x-ray was negative for any acute cardiopulmonary process.  Patient is nontoxic appearing and not in need of emergent medical intervention.  Suspect patient may have viral bronchitis.  No need for antibiotic therapy at this time.  Do not think that strep testing is necessary given appearance of posterior pharynx on exam.  Uvula appears normal.  Do not think COVID testing is necessary given duration of symptoms and would not change treatment.  Recommended symptom control with over the counter medications that are safe with hypertension.  I do think patient would benefit from prednisone to decrease inflammation and harsh cough.  Patient has taken prednisone before and tolerated well.  No obvious contraindications to prednisone noted in patient's history.  Patient does have elevated blood pressure reading with recheck being slightly improved.  This appears baseline for patient as he is followed by cardiology for hypertension management.  He states that he has been taking his blood pressure as prescribed.  No signs of hypertensive urgency on exam and neuro exam is normal.  Patient advised to monitor blood pressure very closely at home with home cuff and to follow-up if it remains elevated. Return if symptoms fail to improve.. Patient states understanding and is agreeable.  Discharged with PCP followup.  Final Clinical Impressions(s) / UC Diagnoses   Final diagnoses:  Viral  upper respiratory tract infection with cough     Discharge Instructions      Chest x-ray was normal.  I have prescribed you prednisone steroid and a cough medication to alleviate symptoms.  Please follow-up if any symptoms persist or worsen.     ED Prescriptions     Medication Sig Dispense Auth. Provider   predniSONE (DELTASONE) 20 MG  tablet Take 2 tablets (40 mg total) by mouth daily for 5 days. 10 tablet [place], [place] E, [place]   benzonatate (TESSALON) 100 MG capsule Take 1 capsule (100 mg total) by mouth every 8 (eight) hours as needed for cough. 21 capsule [place], Acie Fredrickson, [place]      PDMP not reviewed this encounter.   Gustavus Bryant, [place] 08/25/22 [phone]

## 2022-08-25 NOTE — ED Triage Notes (Signed)
Pt here for URI sx and sinus congestion x 1 week

## 2022-08-25 NOTE — Discharge Instructions (Addendum)
Chest x-ray was normal.  I have prescribed you prednisone steroid and a cough medication to alleviate symptoms.  Please follow-up if any symptoms persist or worsen.

## 2022-09-20 ENCOUNTER — Other Ambulatory Visit: Payer: Self-pay

## 2022-09-20 DIAGNOSIS — E7800 Pure hypercholesterolemia, unspecified: Secondary | ICD-10-CM

## 2022-09-20 DIAGNOSIS — I10 Essential (primary) hypertension: Secondary | ICD-10-CM

## 2022-09-20 DIAGNOSIS — Z79899 Other long term (current) drug therapy: Secondary | ICD-10-CM

## 2022-09-20 MED ORDER — HYDROCHLOROTHIAZIDE 25 MG PO TABS: 25.0000 mg | ORAL_TABLET | Freq: Every day | ORAL | 0 refills | Status: DC

## 2022-11-05 ENCOUNTER — Telehealth: Payer: BC Managed Care – PPO | Admitting: Nurse Practitioner

## 2022-11-05 DIAGNOSIS — J069 Acute upper respiratory infection, unspecified: Secondary | ICD-10-CM

## 2022-11-05 MED ORDER — FLUTICASONE PROPIONATE 50 MCG/ACT NA SUSP: 2.0000 | Freq: Every day | NASAL | 6 refills | Status: DC

## 2022-11-05 MED ORDER — CHLORPHEN-PE-ACETAMINOPHEN 4-10-325 MG PO TABS: 1.0000 | ORAL_TABLET | Freq: Four times a day (QID) | ORAL | 0 refills | Status: DC | PRN

## 2022-11-05 NOTE — Progress Notes (Signed)
Virtual Visit Consent   Matthew Navarro, you are scheduled for a virtual visit with Matthew Navarro Done, [place], a [facility] provider, today.     Just as with appointments in the office, your consent must be obtained to participate.  Your consent will be active for this visit and any virtual visit you may have with one of our providers in the next 365 days.     If you have a MyChart account, a copy of this consent can be sent to you electronically.  All virtual visits are billed to your insurance company just like a traditional visit in the office.    As this is a virtual visit, video technology does not allow for your provider to perform a traditional examination.  This may limit your provider's ability to fully assess your condition.  If your provider identifies any concerns that need to be evaluated in person or the need to arrange testing (such as labs, EKG, etc.), we will make arrangements to do so.     Although advances in technology are sophisticated, we cannot ensure that it will always work on either your end or our end.  If the connection with a video visit is poor, the visit may have to be switched to a telephone visit.  With either a video or telephone visit, we are not always able to ensure that we have a secure connection.     I need to obtain your verbal consent now.   Are you willing to proceed with your visit today? YES   Mavrick Mcquigg has provided verbal consent on 11/05/2022 for a virtual visit (video or telephone).   Matthew Navarro Done, FNP   Date: 11/05/2022 1:15 PM   Virtual Visit via Video Note   I, Matthew Joelle [place], connected with Matthew Navarro ([id number], 01/07/1976) on 11/05/22 at  1:30 PM EST by a video-enabled telemedicine application and verified that I am speaking with the correct person using two identifiers.  Location: Patient: Virtual Visit Location Patient: Home Provider: Virtual Visit Location Provider: Mobile   I discussed the limitations  of evaluation and management by telemedicine and the availability of in person appointments. The patient expressed understanding and agreed to proceed.    History of Present Illness: Matthew Navarro is a 47 y.o. who identifies as a male who was assigned male at birth, and is being seen today for nasal congestion.  HPI: URI  This is a new problem. The current episode started in the past 7 days. The problem has been waxing and waning. The maximum temperature recorded prior to his arrival was 100.4 - 100.9 F. The fever has been present for 1 to 2 days. Associated symptoms include congestion, coughing and rhinorrhea. Pertinent negatives include no headaches or sore throat. Treatments tried: dayqui; The treatment provided mild relief.    Review of Systems  HENT:  Positive for congestion and rhinorrhea. Negative for sore throat.   Respiratory:  Positive for cough.   Neurological:  Negative for headaches.    Problems:  Patient Active Problem List   Diagnosis Date Noted   Hypertension 05/21/2021   Dyspnea on exertion 05/21/2021   Seasonal allergies 05/21/2021   GERD (gastroesophageal reflux disease) 05/21/2021   Angioedema 09/21/2015   Allergic reaction 09/21/2015    Allergies:  Allergies  Allergen Reactions   Amlodipine     Tongue swelling   Amlodipine-Olmesartan     Tongue swelling   Pantoprazole Swelling   Medications:  Current Outpatient Medications:    atorvastatin (LIPITOR)  20 MG tablet, Take 20 mg by mouth daily., Disp: , Rfl:    benzonatate (TESSALON) 100 MG capsule, Take 1 capsule (100 mg total) by mouth every 8 (eight) hours as needed for cough., Disp: 21 capsule, Rfl: 0   diltiazem (CARDIZEM CD) 180 MG 24 hr capsule, Take 180 mg by mouth daily., Disp: , Rfl:    EPINEPHrine 0.3 mg/0.3 mL IJ SOAJ injection, Inject 0.3 mLs (0.3 mg total) into the muscle once., Disp: 4 Device, Rfl: 3   famotidine (PEPCID) 20 MG tablet, Take 1 tablet (20 mg total) by mouth 2 (two) times daily.,  Disp: 60 tablet, Rfl: 0   hydrochlorothiazide (HYDRODIURIL) 25 MG tablet, Take 1 tablet (25 mg total) by mouth daily., Disp: 90 tablet, Rfl: 0   hydrOXYzine (ATARAX/VISTARIL) 25 MG tablet, Take 0.5-1 tablets (12.5-25 mg total) by mouth every 8 (eight) hours as needed for itching., Disp: 30 tablet, Rfl: 0   Multiple Vitamin (MULTIVITAMIN) capsule, Take 1 capsule by mouth daily., Disp: , Rfl:    spironolactone (ALDACTONE) 25 MG tablet, Take 1 tablet (25 mg total) by mouth daily., Disp: 90 tablet, Rfl: 2  Observations/Objective: Patient is well-developed, well-nourished in no acute distress.  Resting comfortably  at home.  Head is normocephalic, atraumatic.  No labored breathing.  Speech is clear and coherent with logical content.  Patient is alert and oriented at baseline.  Nasal sounding voice  Assessment and Plan:  [name] in today with chief complaint of No chief complaint on file.   1. URI with cough and congestion 1. Take meds as prescribed 2. Use a cool mist humidifier especially during the winter months and when heat has been humid. 3. Use saline nose sprays frequently 4. Saline irrigations of the nose can be very helpful if done frequently.  * 4X daily for 1 week*  * Use of a nettie pot can be helpful with this. Follow directions with this* 5. Drink plenty of fluids 6. Keep thermostat turn down low 7.For any cough or congestion- norel AD 8. For fever or aces or pains- take tylenol or ibuprofen appropriate for age and weight.  * for fevers greater than 101 orally you may alternate ibuprofen and tylenol every  3 hours.     Meds ordered this encounter  Medications   fluticasone (FLONASE) 50 MCG/ACT nasal spray    Sig: Place 2 sprays into both nostrils daily.    Dispense:  16 g    Refill:  6    Order Specific Question:   Supervising Provider    Answer:   Merrilee Jansky [id number]   Chlorphen-PE-Acetaminophen 4-10-325 MG TABS    Sig: Take 1 tablet by mouth  every 6 (six) hours as needed.    Dispense:  20 tablet    Refill:  0    Order Specific Question:   Supervising Provider    Answer:   Merrilee Jansky [id number]      Follow Up Instructions: I discussed the assessment and treatment plan with the patient. The patient was provided an opportunity to ask questions and all were answered. The patient agreed with the plan and demonstrated an understanding of the instructions.  A copy of instructions were sent to the patient via MyChart.  The patient was advised to call back or seek an in-person evaluation if the symptoms worsen or if the condition fails to improve as anticipated.  Time:  I spent 6 minutes with the patient via telehealth technology discussing the above problems/concerns.  Matthew Navarro Done, FNP

## 2022-11-05 NOTE — Patient Instructions (Signed)
Matthew Navarro, thank you for joining Matthew Pierini, FNP for today's virtual visit.  While this provider is not your primary care provider (PCP), if your PCP is located in our provider database this encounter information will be shared with them immediately following your visit.   A [facility] MyChart account gives you access to today's visit and all your visits, tests, and labs performed at [facility] " click here if you don't have a [facility] MyChart account or go to [email or web address]  Consent: (Patient) Matthew Navarro provided verbal consent for this virtual visit at the beginning of the encounter.  Current Medications:  Current Outpatient Medications:    Chlorphen-PE-Acetaminophen 4-10-325 MG TABS, Take 1 tablet by mouth every 6 (six) hours as needed., Disp: 20 tablet, Rfl: 0   fluticasone (FLONASE) 50 MCG/ACT nasal spray, Place 2 sprays into both nostrils daily., Disp: 16 g, Rfl: 6   atorvastatin (LIPITOR) 20 MG tablet, Take 20 mg by mouth daily., Disp: , Rfl:    benzonatate (TESSALON) 100 MG capsule, Take 1 capsule (100 mg total) by mouth every 8 (eight) hours as needed for cough., Disp: 21 capsule, Rfl: 0   diltiazem (CARDIZEM CD) 180 MG 24 hr capsule, Take 180 mg by mouth daily., Disp: , Rfl:    EPINEPHrine 0.3 mg/0.3 mL IJ SOAJ injection, Inject 0.3 mLs (0.3 mg total) into the muscle once., Disp: 4 Device, Rfl: 3   famotidine (PEPCID) 20 MG tablet, Take 1 tablet (20 mg total) by mouth 2 (two) times daily., Disp: 60 tablet, Rfl: 0   hydrochlorothiazide (HYDRODIURIL) 25 MG tablet, Take 1 tablet (25 mg total) by mouth daily., Disp: 90 tablet, Rfl: 0   hydrOXYzine (ATARAX/VISTARIL) 25 MG tablet, Take 0.5-1 tablets (12.5-25 mg total) by mouth every 8 (eight) hours as needed for itching., Disp: 30 tablet, Rfl: 0   Multiple Vitamin (MULTIVITAMIN) capsule, Take 1 capsule by mouth daily., Disp: , Rfl:    spironolactone (ALDACTONE) 25 MG tablet, Take 1 tablet  (25 mg total) by mouth daily., Disp: 90 tablet, Rfl: 2   Medications ordered in this encounter:  Meds ordered this encounter  Medications   fluticasone (FLONASE) 50 MCG/ACT nasal spray    Sig: Place 2 sprays into both nostrils daily.    Dispense:  16 g    Refill:  6    Order Specific Question:   Supervising Provider    Answer:   Merrilee Jansky [id number]   Chlorphen-PE-Acetaminophen 4-10-325 MG TABS    Sig: Take 1 tablet by mouth every 6 (six) hours as needed.    Dispense:  20 tablet    Refill:  0    Order Specific Question:   Supervising Provider    Answer:   Merrilee Jansky [id number]     *If you need refills on other medications prior to your next appointment, please contact your pharmacy*  Follow-Up: Call back or seek an in-person evaluation if the symptoms worsen or if the condition fails to improve as anticipated.  [facility] [phone]  Other Instructions 1. Take meds as prescribed 2. Use a cool mist humidifier especially during the winter months and when heat has been humid. 3. Use saline nose sprays frequently 4. Saline irrigations of the nose can be very helpful if done frequently.  * 4X daily for 1 week*  * Use of a nettie pot can be helpful with this. Follow directions with this* 5. Drink plenty of fluids 6. Keep thermostat turn down low  7.For any cough or congestion- norel AD s prescribed 8. For fever or aces or pains- take tylenol or ibuprofen appropriate for age and weight.  * for fevers greater than 101 orally you may alternate ibuprofen and tylenol every  3 hours.      If you have been instructed to have an in-person evaluation today at a local Urgent Care facility, please use the link below. It will take you to a list of all of our available [facility], including address, phone number and hours of operation. Please do not delay care.  [facility]  If you or a family member do not have a primary care  provider, use the link below to schedule a visit and establish care. When you choose a [facility] primary care physician or advanced practice provider, you gain a long-term partner in health. Find a Primary Care Provider  Learn more about [facility]'s in-office and virtual care options: [facility]

## 2022-11-08 ENCOUNTER — Ambulatory Visit
Admission: RE | Admit: 2022-11-08 | Discharge: 2022-11-08 | Disposition: A | Discharge status: Home / Self Care | Payer: BC Managed Care – PPO | Source: Ambulatory Visit | Attending: Physician Assistant | Admitting: Physician Assistant

## 2022-11-08 VITALS — BP 168/110 | HR 92 | Temp 98.2°F | Resp 16

## 2022-11-08 DIAGNOSIS — J069 Acute upper respiratory infection, unspecified: Secondary | ICD-10-CM

## 2022-11-08 DIAGNOSIS — T7840XA Allergy, unspecified, initial encounter: Secondary | ICD-10-CM | POA: Diagnosis not present

## 2022-11-08 MED ORDER — LORATADINE 10 MG PO TABS: 10.0000 mg | ORAL_TABLET | Freq: Every day | ORAL | 1 refills | Status: DC

## 2022-11-08 MED ORDER — PREDNISONE 10 MG PO TABS: 10.0000 mg | ORAL_TABLET | Freq: Three times a day (TID) | ORAL | 0 refills | Status: DC

## 2022-11-08 NOTE — Discharge Instructions (Signed)
Plan is to continue to take Benadryl 25 mg every 6 hours to help counteract the reaction.  (Be cautious with this medication as it does cause some individuals drowsiness) Advised to take Claritin 10 mg once daily as this will help counteract allergy reaction. Advised take prednisone 10 mg 3 times a day for 4 days only as this will help counteract and resolve allergy reaction. Advised follow-up PCP or return to urgent care as needed.

## 2022-11-08 NOTE — ED Provider Notes (Signed)
[facility]    CSN: [id number] Arrival date & time: 11/08/22  1204      History   Chief Complaint Chief Complaint  Patient presents with   Allergic Reaction    My inner lip has been swollen since last night and it won't go down. - Entered by patient    HPI Matthew Navarro is a 47 y.o. male.   Male presents with swelling of the left upper lip and of the left hand and left foot.  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  He does not recall the name of the condition.  He indicates that yesterday he started having some swelling of the left upper lip that tended to get worse last night this morning.  He indicates he also had some swelling, redness of the left wrist area and the left foot which has tended to improve.  He indicates he has been taking Benadryl several doses over the past 24 hours and this has helped reduce the swelling.  Patient indicates he is not having any shortness of breath, swelling of the tongue, or difficulty breathing.  He indicates he has had some mild upper respiratory congestion over the past couple days but he believes that this is improving.  He relates that the production has been mainly clear without fever or chills.  He does indicate he has an allergist that he sees on a regular basis.  Denies any exposure to new foods, liquids, or chemicals.   Allergic Reaction Presenting symptoms: rash (left upper lip swelling)     Past Medical History:  Diagnosis Date   Angioedema 09/21/2015   BMI 36.0-36.9,adult    DOE (dyspnea on exertion)    after a fall   HTN (hypertension)    Hyperlipidemia    Hypertension     Patient Active Problem List   Diagnosis Date Noted   Hypertension 05/21/2021   Dyspnea on exertion 05/21/2021   Seasonal allergies 05/21/2021   GERD (gastroesophageal reflux disease) 05/21/2021   Angioedema 09/21/2015   Allergic reaction 09/21/2015    History  reviewed. No pertinent surgical history.     Home Medications    Prior to Admission medications   Medication Sig Start Date End Date Taking? Authorizing Provider  loratadine (CLARITIN) 10 MG tablet Take 1 tablet (10 mg total) by mouth daily. 11/08/22  Yes Ellsworth Lennox, PA-C  predniSONE (DELTASONE) 10 MG tablet Take 1 tablet (10 mg total) by mouth 3 (three) times daily. 11/08/22  Yes Ellsworth Lennox, PA-C  atorvastatin (LIPITOR) 20 MG tablet Take 20 mg by mouth daily.    [provider]  benzonatate (TESSALON) 100 MG capsule Take 1 capsule (100 mg total) by mouth every 8 (eight) hours as needed for cough. 08/25/22   Gustavus Bryant, FNP  Chlorphen-PE-Acetaminophen 4-10-325 MG TABS Take 1 tablet by mouth every 6 (six) hours as needed. 11/05/22   Daphine Deutscher, Mary-Margaret, FNP  diltiazem (CARDIZEM CD) 180 MG 24 hr capsule Take 180 mg by mouth daily.    [provider]  EPINEPHrine 0.3 mg/0.3 mL IJ SOAJ injection Inject 0.3 mLs (0.3 mg total) into the muscle once. 09/21/15   Kozlow, Alvira Philips, MD  famotidine (PEPCID) 20 MG tablet Take 1 tablet (20 mg total) by mouth 2 (two) times daily. 01/29/21   Wallis Bamberg, PA-C  fluticasone (FLONASE) 50 MCG/ACT nasal spray Place 2 sprays into both nostrils daily. 11/05/22   Bennie Pierini, FNP  hydrochlorothiazide (HYDRODIURIL) 25 MG tablet Take 1 tablet (25 mg total) by mouth daily. 09/20/22   Meriam Sprague, MD  hydrOXYzine (ATARAX/VISTARIL) 25 MG tablet Take 0.5-1 tablets (12.5-25 mg total) by mouth every 8 (eight) hours as needed for itching. 01/29/21   Wallis Bamberg, PA-C  Multiple Vitamin (MULTIVITAMIN) capsule Take 1 capsule by mouth daily.    [provider]  spironolactone (ALDACTONE) 25 MG tablet Take 1 tablet (25 mg total) by mouth daily. 04/15/22   Meriam Sprague, MD    Family History Family History  Problem Relation Age of Onset   Heart attack Father 55   Diabetes Paternal Uncle    Parkinson's disease Paternal Grandmother     Breast cancer Paternal Grandmother    CVA Paternal Grandfather    Allergic rhinitis Neg Hx    Angioedema Neg Hx    Asthma Neg Hx    Eczema Neg Hx    Immunodeficiency Neg Hx    Urticaria Neg Hx     Social History Social History   Tobacco Use   Smoking status: Former    Packs/day: 0.08    Years: 8.00    Total pack years: 0.64    Types: Cigarettes    Start date: 1998    Quit date: 11/01/2011    Years since quitting: 11.0   Smokeless tobacco: Never  Vaping Use   Vaping Use: Never used  Substance Use Topics   Alcohol use: Yes    Alcohol/week: 10.0 standard drinks of alcohol    Types: 10 Standard drinks or equivalent per week    Comment: wine or beer   Drug use: No     Allergies   Amlodipine, Amlodipine-olmesartan, and Pantoprazole   Review of Systems Review of Systems  Skin:  Positive for rash (left upper lip swelling).     Physical Exam Triage Vital Signs ED Triage Vitals  Enc Vitals Group     BP 11/08/22 1220 (!) 168/110     Pulse Rate 11/08/22 1220 92     Resp 11/08/22 1220 16     Temp 11/08/22 1220 98.2 F (36.8 C)     Temp Source 11/08/22 1220 Oral     SpO2 11/08/22 1220 97 %     Weight --      Height --      Head Circumference --      Peak Flow --      Pain Score 11/08/22 1219 0     Pain Loc --      Pain Edu? --      Excl. in GC? --    No data found.  Updated Vital Signs BP (!) 168/110 (BP Location: Left Arm)   Pulse 92   Temp 98.2 F (36.8 C) (Oral)   Resp 16   SpO2 97%   Visual Acuity Right Eye Distance:   Left Eye Distance:   Bilateral Distance:    Right Eye Near:   Left Eye Near:    Bilateral Near:     Physical Exam Constitutional:      Appearance: Normal appearance.  HENT:     Right Ear: Tympanic membrane and ear canal normal.     Left Ear: Tympanic membrane and ear canal normal.     Mouth/Throat:     Mouth: Mucous membranes are moist.     Pharynx: Pharyngeal swelling (mild left upper lip swelling) present.   Cardiovascular:     Rate and Rhythm: Normal rate and regular rhythm.  Heart sounds: Normal heart sounds.  Pulmonary:     Effort: Pulmonary effort is normal.     Breath sounds: Normal breath sounds and air entry. No wheezing, rhonchi or rales.  Lymphadenopathy:     Cervical: No cervical adenopathy.  Neurological:     Mental Status: He is alert.      UC Treatments / Results  Labs (all labs ordered are listed, but only abnormal results are displayed) Labs Reviewed - No data to display  EKG   Radiology No results found.  Procedures Procedures (including critical care time)  Medications Ordered in UC Medications - No data to display  Initial Impression / Assessment and Plan / UC Course  I have reviewed the triage vital signs and the nursing notes.  Pertinent labs & imaging results that were available during my care of the patient were reviewed by me and considered in my medical decision making (see chart for details).    Plan: 1.  The acute upper respiratory tract infection will be treated with the following: A.  Advised take Claritin 10 mg once daily for the congestion. 2.  The allergic reaction will be treated with the following: A.  Advised patient to take Benadryl 25 mg every 6 hours on a regular basis to counteract reaction. B.  Claritin 10 mg once daily to help counteract the reaction. C.  Prednisone 10 mg every 8 hours for 4 days only to help counteract reaction and allergy response. 3.  Patient advised to follow-up with allergist or PCP and return to urgent care as needed. Final Clinical Impressions(s) / UC Diagnoses   Final diagnoses:  Acute upper respiratory infection  Allergic reaction, initial encounter     Discharge Instructions      Plan is to continue to take Benadryl 25 mg every 6 hours to help counteract the reaction.  (Be cautious with this medication as it does cause some individuals drowsiness) Advised to take Claritin 10 mg once daily as  this will help counteract allergy reaction. Advised take prednisone 10 mg 3 times a day for 4 days only as this will help counteract and resolve allergy reaction. Advised follow-up PCP or return to urgent care as needed.    ED Prescriptions     Medication Sig Dispense Auth. Provider   loratadine (CLARITIN) 10 MG tablet Take 1 tablet (10 mg total) by mouth daily. 15 tablet Nyoka Lint, PA-C   predniSONE (DELTASONE) 10 MG tablet Take 1 tablet (10 mg total) by mouth 3 (three) times daily. 12 tablet Nyoka Lint, PA-C      PDMP not reviewed this encounter.   Nyoka Lint, PA-C 11/08/22 1240

## 2022-11-08 NOTE — ED Triage Notes (Signed)
Pt said last night his top lip was swelling. Hx bc autoimmune and he has reaction to different things. Pt said several places have been swelling on his left side over the past few days. Most have resided today.

## 2022-12-06 DIAGNOSIS — Z0000 Encounter for general adult medical examination without abnormal findings: Secondary | ICD-10-CM | POA: Diagnosis not present

## 2022-12-06 DIAGNOSIS — Z6835 Body mass index (BMI) 35.0-35.9, adult: Secondary | ICD-10-CM | POA: Diagnosis not present

## 2022-12-06 DIAGNOSIS — R739 Hyperglycemia, unspecified: Secondary | ICD-10-CM | POA: Diagnosis not present

## 2022-12-25 NOTE — Progress Notes (Unsigned)
Cardiology Clinic Note   Patient Name: Matthew Navarro Date of Encounter: 12/27/2022  Primary Care Provider:  Janine Limbo, PA-C Primary Cardiologist:  Freada Bergeron, MD  Patient Profile    Matthew Navarro is a 47 y.o. male with a past medical history of nonobstructive CAD per coronary CT, hypertension, hyperlipidemia, GERD who presents to the clinic today for 1 year follow-up.  Past Medical History    Past Medical History:  Diagnosis Date   Angioedema 09/21/2015   BMI 36.0-36.9,adult    DOE (dyspnea on exertion)    after a fall   HTN (hypertension)    Hyperlipidemia    Hypertension    History reviewed. No pertinent surgical history.  Allergies  Allergies  Allergen Reactions   Amlodipine     Tongue swelling   Amlodipine-Olmesartan     Tongue swelling   Pantoprazole Swelling    History of Present Illness    Matthew Navarro has a past medical history of: Nonobstructive CAD. Echo 12/21/2020: EF 60 to 65%. Coronary CT 12/28/2020: Coronary calcium score 18.6 (86th percentile).  Mixed plaque proximal LAD (<50%). Hypertension. Hyperlipidemia. Lipid panel 12/06/2022: LDL 94, HDL 48, TG 212, total 178. GERD.  Matthew Navarro was initially seen in the office by Dr. Johney Frame on 11/27/2020 for further evaluation of DOE at the request of Greta Whitewater, PA-C.  At time patient noted dyspnea on exertion with wheezing and chest pressure while walking inclines that resolved with rest.  Echo showed normal LV function with no significant valvular abnormalities.  Coronary CT showed nonobstructive CAD with a calcium score of 18.6 (as detailed above).  He was referred to pulmonology.  His PFTs were normal and cardiopulmonary exercise test showed severe hypertensive response to exercise with mild chronotropic incompetence.  Patient was last seen in the office by Dr. Johney Frame on 12/15/2021.  At that time he was doing well.  His carvedilol was stopped and he was switched to  hydrochlorothiazide.  Today, patient is doing well. Patient denies shortness of breath or dyspnea on exertion. No chest pain, pressure, or tightness. Denies lower extremity edema, orthopnea, or PND. No palpitations. He is very active strength training 4-5 days a week and doing cardio 3 day a week.  He is hypertensive today in the office with BP 148/72 at intake and 142/82 at recheck.  Last 3 BP readings (including intake reading today) are below.  Patient does not check his BP at home but feels when he goes to other appointments it is typically >140/80.  He felt he had better control of BP when he was taking amlodipine before he had an allergic reaction to it.  He denies headaches, dizziness, or vision changes.  He reports a strong family history of hypertension.  Patient reports he snores at night according to his wife.  He is amenable to doing a sleep study.  BP Readings from Last 3 Encounters:  12/27/22 (!) 148/72  11/08/22 (!) 168/110  08/25/22 (!) 155/107     Home Medications    Current Meds  Medication Sig   atorvastatin (LIPITOR) 40 MG tablet Take 1 tablet (40 mg total) by mouth daily.   diltiazem (CARDIZEM CD) 240 MG 24 hr capsule Take 1 capsule (240 mg total) by mouth daily.   EPINEPHrine 0.3 mg/0.3 mL IJ SOAJ injection Inject 0.3 mLs (0.3 mg total) into the muscle once.   hydrochlorothiazide (HYDRODIURIL) 25 MG tablet Take 1 tablet (25 mg total) by mouth daily.   Multiple Vitamin (MULTIVITAMIN) capsule Take  1 capsule by mouth daily.   spironolactone (ALDACTONE) 25 MG tablet Take 1 tablet (25 mg total) by mouth daily.   [DISCONTINUED] atorvastatin (LIPITOR) 20 MG tablet Take 20 mg by mouth daily.   [DISCONTINUED] diltiazem (CARDIZEM CD) 180 MG 24 hr capsule Take 180 mg by mouth daily.    Family History    Family History  Problem Relation Age of Onset   Heart attack Father 41   Diabetes Paternal Uncle    Parkinson's disease Paternal Grandmother    Breast cancer Paternal  Grandmother    CVA Paternal Grandfather    Allergic rhinitis Neg Hx    Angioedema Neg Hx    Asthma Neg Hx    Eczema Neg Hx    Immunodeficiency Neg Hx    Urticaria Neg Hx    He indicated that his mother is alive. He indicated that his father is deceased. He indicated that his maternal grandmother is alive. He indicated that his maternal grandfather is alive. He indicated that his paternal grandmother is alive. He indicated that the status of his paternal grandfather is unknown. He indicated that his paternal uncle is deceased. He indicated that the status of his neg hx is unknown.   Social History    Social History   Socioeconomic History   Marital status: Married    Spouse name: Not on file   Number of children: Not on file   Years of education: Not on file   Highest education level: Not on file  Occupational History   Not on file  Tobacco Use   Smoking status: Former    Packs/day: 0.08    Years: 8.00    Total pack years: 0.64    Types: Cigarettes    Start date: 30    Quit date: 11/01/2011    Years since quitting: 11.1   Smokeless tobacco: Never  Vaping Use   Vaping Use: Never used  Substance and Sexual Activity   Alcohol use: Yes    Alcohol/week: 10.0 standard drinks of alcohol    Types: 10 Standard drinks or equivalent per week    Comment: wine or beer   Drug use: No   Sexual activity: Not on file  Other Topics Concern   Not on file  Social History Narrative   Not on file   Social Determinants of Health   Financial Resource Strain: Not on file  Food Insecurity: Not on file  Transportation Needs: Not on file  Physical Activity: Not on file  Stress: Not on file  Social Connections: Not on file  Intimate Partner Violence: Not on file     Review of Systems    General: No chills, fever, night sweats or weight changes.  Cardiovascular:  No chest pain, dyspnea on exertion, edema, orthopnea, palpitations, paroxysmal nocturnal dyspnea. Dermatological: No rash,  lesions/masses Respiratory: No cough, dyspnea Urologic: No hematuria, dysuria Abdominal:   No nausea, vomiting, diarrhea, bright red blood per rectum, melena, or hematemesis Neurologic:  No visual changes, weakness, changes in mental status. All other systems reviewed and are otherwise negative except as noted above.  Physical Exam    VS:  BP (!) 148/72   Pulse 70   Ht '5\' 10"'$  (1.778 m)   Wt 239 lb 12.8 oz (108.8 kg)   SpO2 96%   BMI 34.41 kg/m  , BMI Body mass index is 34.41 kg/m. GEN: Well nourished, well developed, in no acute distress. HEENT: Normal. Neck: Supple, no JVD, carotid bruits, or masses. Cardiac: RRR,  no murmurs, rubs, or gallops. No clubbing, cyanosis, edema.  Radials/DP/PT 2+ and equal bilaterally.  Respiratory:  Respirations regular and unlabored, clear to auscultation bilaterally. GI: Soft, nontender, nondistended. MS: No deformity or atrophy. Skin: Warm and dry, no rash. Neuro: Strength and sensation are intact. Psych: Normal affect.  Accessory Clinical Findings    HYPERTENSION CONTROL Vitals:   12/27/22 1531 12/27/22 1618  BP: (!) 148/72 (!) 142/82    The patient's blood pressure is elevated above target today.  In order to address the patient's elevated BP: A current anti-hypertensive medication was adjusted today.     ECG personally reviewed by me today: NSR, heart rate 70 bpm. No significant changes from 12/15/2021.    Assessment & Plan   Nonobstructive CAD.  Coronary CT February 2022 showed calcium score of 18.6, mixed plaque proximal LAD.  Patient denies chest pain, tightness or pressure.  He is very active doing strength training 4 to 5 days a week and cardio 3 days a week. Continue atorvastatin (see #3). Hypertension.  BP today 148/72 at intake and 142/82 at recheck.  He does not check his BP at home but feels when he goes to other doctors visits his BP is consistently >140/80.  Patient denies vision changes, headaches or dizziness.  Continue  hydrochlorothiazide and spironolactone.  Increase diltiazem to 240 mg a day.  Patient will begin checking his BP at home. Hyperlipidemia.  LDL February 2024 94, not at goal.  Will increase atorvastatin to 40 mg daily. Snoring.  Patient reports he snores at night according to his wife.  He is agreeable to have a sleep study performed.  Will Itamar sleep study.   Disposition: Increase diltiazem to 240 mg daily and atorvastatin to 40 mg daily. Itamar sleep study. Lipid panel and LFTs in 10 weeks. Return in 1 year or sooner as needed.    Justice Britain. Isaul Landi, DNP, NP-C     12/27/2022, 4:09 PM [facility] [place] [address] Office [phone] Fax [phone]

## 2022-12-27 ENCOUNTER — Telehealth: Payer: Self-pay | Admitting: *Deleted

## 2022-12-27 ENCOUNTER — Ambulatory Visit: Payer: BC Managed Care – PPO | Attending: Physician Assistant | Admitting: Student

## 2022-12-27 ENCOUNTER — Encounter: Payer: Self-pay | Admitting: Student

## 2022-12-27 VITALS — BP 142/82 | HR 70 | Ht 70.0 in | Wt 239.8 lb

## 2022-12-27 DIAGNOSIS — I2510 Atherosclerotic heart disease of native coronary artery without angina pectoris: Secondary | ICD-10-CM | POA: Diagnosis not present

## 2022-12-27 DIAGNOSIS — I10 Essential (primary) hypertension: Secondary | ICD-10-CM

## 2022-12-27 DIAGNOSIS — R0683 Snoring: Secondary | ICD-10-CM | POA: Diagnosis not present

## 2022-12-27 DIAGNOSIS — Z79899 Other long term (current) drug therapy: Secondary | ICD-10-CM

## 2022-12-27 DIAGNOSIS — E785 Hyperlipidemia, unspecified: Secondary | ICD-10-CM

## 2022-12-27 MED ORDER — DILTIAZEM HCL ER COATED BEADS 240 MG PO CP24: 240.0000 mg | ORAL_CAPSULE | Freq: Every day | ORAL | 3 refills | Status: DC

## 2022-12-27 MED ORDER — ATORVASTATIN CALCIUM 40 MG PO TABS: 40.0000 mg | ORAL_TABLET | Freq: Every day | ORAL | 3 refills | Status: DC

## 2022-12-27 NOTE — Patient Instructions (Addendum)
Medication Instructions:  Your physician has recommended you make the following change in your medication:   INCREASE the Diltiazem to 240 taking 1 daily  INCREASE the Atorvastatin to 40 mg taking 1 daily   *If you need a refill on your cardiac medications before your next appointment, please call your pharmacy*   Lab Work: 03/10/23 COME TO THE LAB ANYTIME AFTER 7:15 FOR:  FASTING Lipid & LFT (NOTHING TO EAT OR DRINK AFTER MIDNIGHT THE NIGHT BEFORE)   If you have labs (blood work) drawn today and your tests are completely normal, you will receive your results only by: [place] (if you have MyChart) OR A paper copy in the mail If you have any lab test that is abnormal or we need to change your treatment, we will call you to review the results.   Testing/Procedures: Your physician has recommended that you have a sleep study. This test records several body functions during sleep, including: brain activity, eye movement, oxygen and carbon dioxide blood levels, heart rate and rhythm, breathing rate and rhythm, the flow of air through your mouth and nose, snoring, body muscle movements, and chest and belly movement.    Follow-Up: At [facility], you and your health needs are our priority.  As part of our continuing mission to provide you with exceptional heart care, we have created designated Provider Care Teams.  These Care Teams include your primary Cardiologist (physician) and Advanced Practice Providers (APPs -  Physician Assistants and Nurse Practitioners) who all work together to provide you with the care you need, when you need it.  We recommend signing up for the patient portal called "MyChart".  Sign up information is provided on this After Visit Summary.  MyChart is used to connect with patients for Virtual Visits (Telemedicine).  Patients are able to view lab/test results, encounter notes, upcoming appointments, etc.  Non-urgent messages can be sent to your provider as  well.   To learn more about what you can do with MyChart, go to [email or web address].    Your next appointment:   1 year(s)  Provider:   Gwyndolyn Kaufman, MD     Other Instructions

## 2022-12-27 NOTE — Telephone Encounter (Signed)
Pt was seen in the office today by Richardson Dopp, [facility] and was ordered an Itamar study. Pt agreeable to signed waiver and to not open the box until he has been called with PIN#.

## 2023-01-16 ENCOUNTER — Other Ambulatory Visit: Payer: Self-pay

## 2023-01-16 DIAGNOSIS — Z79899 Other long term (current) drug therapy: Secondary | ICD-10-CM

## 2023-01-16 DIAGNOSIS — I10 Essential (primary) hypertension: Secondary | ICD-10-CM

## 2023-01-16 DIAGNOSIS — Z0189 Encounter for other specified special examinations: Secondary | ICD-10-CM

## 2023-01-16 MED ORDER — SPIRONOLACTONE 25 MG PO TABS: 25.0000 mg | ORAL_TABLET | Freq: Every day | ORAL | 3 refills | Status: DC

## 2023-01-17 NOTE — Telephone Encounter (Signed)
Prior Authorization for [facility] sent to [facility] via Phone. Reference # . READY-NO PA REQ for [id number] [phone]

## 2023-01-17 NOTE — Telephone Encounter (Signed)
Pt called back and has confirmed my vm for the PIN#. Pt states he will do sleep study one night this week. Pt aware once results are in we will call with any recommendations. Pt thanked me for the help.   Called and made the patient aware that he may proceed with the [facility] Sleep Study. PIN # provided to the patient. Patient made aware that he will be contacted after the test has been read with the results and any recommendations. Patient verbalized understanding and thanked me for the call.

## 2023-01-17 NOTE — Telephone Encounter (Signed)
Left message for pt that he has been approved for Itamar sleep study. Left PIN# [id number] on vm, though I did ask for pt to call me back to discuss when to do the study and that he did get my vm.

## 2023-01-18 ENCOUNTER — Encounter (INDEPENDENT_AMBULATORY_CARE_PROVIDER_SITE_OTHER): Payer: BC Managed Care – PPO | Admitting: Cardiology

## 2023-01-18 DIAGNOSIS — G4733 Obstructive sleep apnea (adult) (pediatric): Secondary | ICD-10-CM

## 2023-01-19 ENCOUNTER — Ambulatory Visit: Payer: BC Managed Care – PPO | Attending: Student

## 2023-01-19 DIAGNOSIS — E785 Hyperlipidemia, unspecified: Secondary | ICD-10-CM

## 2023-01-19 DIAGNOSIS — Z79899 Other long term (current) drug therapy: Secondary | ICD-10-CM

## 2023-01-19 DIAGNOSIS — R0683 Snoring: Secondary | ICD-10-CM

## 2023-01-19 NOTE — Procedures (Signed)
SLEEP STUDY REPORT Patient Information Study Date: 01/19/2023 Patient Name: Matthew Navarro Patient ID: [id number] Birth Date: 01-20-1976 Age: 47 Gender: Male BMI: 34.4 (W=240 lb, H=5' 10'') Stopbang: 4 Referring Physician: Richardson Dopp, PA  TEST DESCRIPTION: Home sleep apnea testing was completed using the WatchPat, a Type 1 device, utilizing peripheral arterial tonometry (PAT), chest movement, actigraphy, pulse oximetry, pulse rate, body position and snore.  AHI was calculated with apnea and hypopnea using valid sleep time as the denominator. RDI includes apneas, hypopneas, and RERAs.  The data acquired and the scoring of sleep and all associated events were performed in accordance with the recommended standards and specifications as outlined in the AASM Manual for the Scoring of Sleep and Associated Events 2.2.0 (2015).  FINDINGS:  1.  Severe Obstructive Sleep Apnea with AHI 30.3/hr.   2.  No Central Sleep Apnea with pAHIc 1/hr.  3.  Oxygen desaturations as low as 84%.  4.  Mild to moderate snoring was present. O2 sats were < 88% for 4.3 min.  5.  Total sleep time was 6 hrs and 16 min.  6.  21.1% of total sleep time was spent in REM sleep.   7.  Normal sleep onset latency at 22 min.   8.  Shortened REM sleep onset latency at 66 min.   9.  Total awakenings were 14.  10. Arrhythmia detection:  None  DIAGNOSIS:   Severe Obstructive Sleep Apnea (G47.33)  RECOMMENDATIONS:   1.  Clinical correlation of these findings is necessary.  The decision to treat obstructive sleep apnea (OSA) is usually based on the presence of apnea symptoms or the presence of associated medical conditions such as Hypertension, Congestive Heart Failure, Atrial Fibrillation or Obesity.  The most common symptoms of OSA are snoring, gasping for breath while sleeping, daytime sleepiness and fatigue.   2.  Initiating apnea therapy is recommended given the presence of symptoms and/or associated conditions.  Recommend proceeding with one of the following:     a.  Auto-CPAP therapy with a pressure range of 5-20cm H2O.     b.  An oral appliance (OA) that can be obtained from certain dentists with expertise in sleep medicine.  These are primarily of use in non-obese patients with mild and moderate disease.     c.  An ENT consultation which may be useful to look for specific causes of obstruction and possible treatment options.     d.  If patient is intolerant to PAP therapy, consider referral to ENT for evaluation for hypoglossal nerve stimulator.   3.  Close follow-up is necessary to ensure success with CPAP or oral appliance therapy for maximum benefit.  4.  A follow-up oximetry study on CPAP is recommended to assess the adequacy of therapy and determine the need for supplemental oxygen or the potential need for Bi-level therapy.  An arterial blood gas to determine the adequacy of baseline ventilation and oxygenation should also be considered.  5.  Healthy sleep recommendations include:  adequate nightly sleep (normal 7-9 hrs/night), avoidance of caffeine after noon and alcohol near bedtime, and maintaining a sleep environment that is cool, dark and quiet.  6.  Weight loss for overweight patients is recommended.  Even modest amounts of weight loss can significantly improve the severity of sleep apnea.  7.  Snoring recommendations include:  weight loss where appropriate, side sleeping, and avoidance of alcohol before bed.  8.  Operation of motor vehicle should be avoided when sleepy.  Signature: Fransico Him,  MD; Leonidas Romberg; Diplomat, American Board of Sleep Medicine Electronically Signed: 01/19/2023

## 2023-02-13 ENCOUNTER — Other Ambulatory Visit: Payer: Self-pay

## 2023-02-13 DIAGNOSIS — E7800 Pure hypercholesterolemia, unspecified: Secondary | ICD-10-CM

## 2023-02-13 DIAGNOSIS — Z79899 Other long term (current) drug therapy: Secondary | ICD-10-CM

## 2023-02-13 DIAGNOSIS — I10 Essential (primary) hypertension: Secondary | ICD-10-CM

## 2023-02-13 MED ORDER — HYDROCHLOROTHIAZIDE 25 MG PO TABS: 25.0000 mg | ORAL_TABLET | Freq: Every day | ORAL | 3 refills | Status: DC

## 2023-02-21 ENCOUNTER — Telehealth: Payer: Self-pay | Admitting: *Deleted

## 2023-02-21 DIAGNOSIS — R0683 Snoring: Secondary | ICD-10-CM

## 2023-02-21 DIAGNOSIS — G4733 Obstructive sleep apnea (adult) (pediatric): Secondary | ICD-10-CM

## 2023-02-21 DIAGNOSIS — I2510 Atherosclerotic heart disease of native coronary artery without angina pectoris: Secondary | ICD-10-CM

## 2023-02-21 NOTE — Addendum Note (Signed)
Addended by: Reesa Chew on: 02/21/2023 06:18 PM   Modules accepted: Orders

## 2023-02-21 NOTE — Telephone Encounter (Signed)
The patient has been notified of the result. Left detailed message on voicemail and informed patient to call back..Matthew Navarro, CMA   

## 2023-02-21 NOTE — Telephone Encounter (Signed)
-----   Message from Gaynelle Cage, [place] sent at 01/19/2023 10:56 AM EDT -----  ----- Message ----- From: Quintella Reichert, MD Sent: 01/19/2023  10:12 AM EDT To: [facility] Sleep Studies  Please let patient know that they have sleep apnea.  Recommend therapeutic CPAP titration for treatment of patient's sleep disordered breathing.  If unable to perform an in lab titration then initiate ResMed auto CPAP from 4 to 15cm H2O with heated humidity and mask of choice and overnight pulse ox on CPAP.

## 2023-02-21 NOTE — Telephone Encounter (Signed)
RETURN CALL: The patient has been notified of the result and verbalized understanding.  All questions (if any) were answered. Latrelle Dodrill, CMA 02/21/2023 6:15 PM    Will precert titration

## 2023-03-10 ENCOUNTER — Other Ambulatory Visit: Payer: Self-pay | Admitting: *Deleted

## 2023-03-10 ENCOUNTER — Ambulatory Visit: Payer: BC Managed Care – PPO | Attending: Cardiology

## 2023-03-10 DIAGNOSIS — E785 Hyperlipidemia, unspecified: Secondary | ICD-10-CM

## 2023-03-10 DIAGNOSIS — I2510 Atherosclerotic heart disease of native coronary artery without angina pectoris: Secondary | ICD-10-CM | POA: Diagnosis not present

## 2023-03-10 LAB — HEPATIC FUNCTION PANEL
ALT: 28 IU/L (ref 0–44)
AST: 23 IU/L (ref 0–40)
Albumin: 4.8 g/dL (ref 4.1–5.1)
Alkaline Phosphatase: 68 IU/L (ref 44–121)
Bilirubin Total: 0.5 mg/dL (ref 0.0–1.2)
Bilirubin, Direct: 0.14 mg/dL (ref 0.00–0.40)
Total Protein: 7.4 g/dL (ref 6.0–8.5)

## 2023-03-10 LAB — LIPID PANEL
Chol/HDL Ratio: 3.6 ratio (ref 0.0–5.0)
Cholesterol, Total: 171 mg/dL (ref 100–199)
HDL: 48 mg/dL (ref >39)
LDL Chol Calc (NIH): 95 mg/dL (ref 0–99)
Triglycerides: 163 mg/dL — ABNORMAL HIGH (ref 0–149)
VLDL Cholesterol Cal: 28 mg/dL (ref 5–40)

## 2023-06-09 DIAGNOSIS — R739 Hyperglycemia, unspecified: Secondary | ICD-10-CM | POA: Diagnosis not present

## 2023-06-09 DIAGNOSIS — Z79899 Other long term (current) drug therapy: Secondary | ICD-10-CM | POA: Diagnosis not present

## 2023-06-09 DIAGNOSIS — E785 Hyperlipidemia, unspecified: Secondary | ICD-10-CM | POA: Diagnosis not present

## 2023-06-09 DIAGNOSIS — I700 Atherosclerosis of aorta: Secondary | ICD-10-CM | POA: Diagnosis not present

## 2023-06-09 DIAGNOSIS — I10 Essential (primary) hypertension: Secondary | ICD-10-CM | POA: Diagnosis not present

## 2023-06-26 NOTE — Telephone Encounter (Signed)
Prior Authorization for TITRATION sent to [facility] via web portal. Tracking Number . READY- NO PA REQ-CALL [id number]

## 2023-08-08 DIAGNOSIS — G4730 Sleep apnea, unspecified: Secondary | ICD-10-CM | POA: Diagnosis not present

## 2023-09-08 DIAGNOSIS — G4733 Obstructive sleep apnea (adult) (pediatric): Secondary | ICD-10-CM | POA: Diagnosis not present

## 2023-10-08 DIAGNOSIS — G4730 Sleep apnea, unspecified: Secondary | ICD-10-CM | POA: Diagnosis not present

## 2023-11-02 DIAGNOSIS — Z6838 Body mass index (BMI) 38.0-38.9, adult: Secondary | ICD-10-CM | POA: Diagnosis not present

## 2023-11-02 DIAGNOSIS — Z7189 Other specified counseling: Secondary | ICD-10-CM | POA: Diagnosis not present

## 2023-11-02 DIAGNOSIS — G4733 Obstructive sleep apnea (adult) (pediatric): Secondary | ICD-10-CM | POA: Diagnosis not present

## 2023-11-08 DIAGNOSIS — G4730 Sleep apnea, unspecified: Secondary | ICD-10-CM | POA: Diagnosis not present

## 2023-12-04 ENCOUNTER — Other Ambulatory Visit: Payer: Self-pay

## 2023-12-04 ENCOUNTER — Other Ambulatory Visit: Payer: Self-pay | Admitting: Student

## 2023-12-04 DIAGNOSIS — I10 Essential (primary) hypertension: Secondary | ICD-10-CM

## 2023-12-04 DIAGNOSIS — Z0189 Encounter for other specified special examinations: Secondary | ICD-10-CM

## 2023-12-04 DIAGNOSIS — Z79899 Other long term (current) drug therapy: Secondary | ICD-10-CM

## 2023-12-04 MED ORDER — SPIRONOLACTONE 25 MG PO TABS
25.0000 mg | ORAL_TABLET | Freq: Every day | ORAL | 0 refills | Status: DC
Start: 2023-12-04 — End: 2023-12-06

## 2023-12-06 ENCOUNTER — Other Ambulatory Visit: Payer: Self-pay | Admitting: Student

## 2023-12-06 DIAGNOSIS — Z79899 Other long term (current) drug therapy: Secondary | ICD-10-CM

## 2023-12-06 DIAGNOSIS — I10 Essential (primary) hypertension: Secondary | ICD-10-CM

## 2023-12-06 DIAGNOSIS — Z0189 Encounter for other specified special examinations: Secondary | ICD-10-CM

## 2023-12-06 MED ORDER — ATORVASTATIN CALCIUM 40 MG PO TABS: 40.0000 mg | ORAL_TABLET | Freq: Every day | ORAL | 0 refills | Status: DC

## 2023-12-06 MED ORDER — SPIRONOLACTONE 25 MG PO TABS
25.0000 mg | ORAL_TABLET | Freq: Every day | ORAL | 0 refills | Status: DC
Start: 2023-12-06 — End: 2024-03-06

## 2023-12-06 NOTE — Addendum Note (Signed)
 Addended by: Debrah Fan, Shara Hartis L on: 12/06/2023 02:01 PM   Modules accepted: Orders

## 2023-12-09 DIAGNOSIS — G4730 Sleep apnea, unspecified: Secondary | ICD-10-CM | POA: Diagnosis not present

## 2023-12-12 DIAGNOSIS — R739 Hyperglycemia, unspecified: Secondary | ICD-10-CM | POA: Diagnosis not present

## 2023-12-12 DIAGNOSIS — Z79899 Other long term (current) drug therapy: Secondary | ICD-10-CM | POA: Diagnosis not present

## 2023-12-12 DIAGNOSIS — E785 Hyperlipidemia, unspecified: Secondary | ICD-10-CM | POA: Diagnosis not present

## 2023-12-12 DIAGNOSIS — G4730 Sleep apnea, unspecified: Secondary | ICD-10-CM | POA: Diagnosis not present

## 2023-12-12 DIAGNOSIS — I10 Essential (primary) hypertension: Secondary | ICD-10-CM | POA: Diagnosis not present

## 2023-12-27 NOTE — Telephone Encounter (Signed)
**Note De-identified [place] Obfuscation**  **Note De-Identified [place] Luckow Obfuscation** WQ: [facility] ELAM), Transferred to  8/26 READY- NO PA REQ-CALL WUJ#[id number] Left a message to schedule the appt. 07/06/23 & 07/14/23 TP. Called to schedule appt. pt stated pcp has set him up with CPAP machine, he stated if he decides to move forward with study he would call us back 07/25/23 TP.       Per this note from the sleep lab, the pts PCP set him up with a CPAP machine. I have cancelled the CPAP Titration order from the pts active orders as it is no longer needed.

## 2023-12-31 ENCOUNTER — Other Ambulatory Visit: Payer: Self-pay | Admitting: Student

## 2024-01-06 DIAGNOSIS — G4730 Sleep apnea, unspecified: Secondary | ICD-10-CM | POA: Diagnosis not present

## 2024-02-02 ENCOUNTER — Other Ambulatory Visit: Payer: Self-pay | Admitting: Student

## 2024-02-18 ENCOUNTER — Other Ambulatory Visit: Payer: Self-pay | Admitting: Student

## 2024-02-22 DIAGNOSIS — M7651 Patellar tendinitis, right knee: Secondary | ICD-10-CM | POA: Diagnosis not present

## 2024-02-22 DIAGNOSIS — R52 Pain, unspecified: Secondary | ICD-10-CM | POA: Diagnosis not present

## 2024-02-22 DIAGNOSIS — M25561 Pain in right knee: Secondary | ICD-10-CM | POA: Diagnosis not present

## 2024-03-06 ENCOUNTER — Other Ambulatory Visit: Payer: Self-pay

## 2024-03-06 DIAGNOSIS — Z79899 Other long term (current) drug therapy: Secondary | ICD-10-CM

## 2024-03-06 DIAGNOSIS — I10 Essential (primary) hypertension: Secondary | ICD-10-CM

## 2024-03-06 DIAGNOSIS — Z0189 Encounter for other specified special examinations: Secondary | ICD-10-CM

## 2024-03-06 DIAGNOSIS — E7800 Pure hypercholesterolemia, unspecified: Secondary | ICD-10-CM

## 2024-03-06 MED ORDER — HYDROCHLOROTHIAZIDE 25 MG PO TABS: 25.0000 mg | ORAL_TABLET | Freq: Every day | ORAL | 0 refills | Status: DC

## 2024-03-06 MED ORDER — SPIRONOLACTONE 25 MG PO TABS: 25.0000 mg | ORAL_TABLET | Freq: Every day | ORAL | 0 refills | Status: DC

## 2024-03-06 MED ORDER — ATORVASTATIN CALCIUM 40 MG PO TABS: 40.0000 mg | ORAL_TABLET | Freq: Every day | ORAL | 0 refills | Status: DC

## 2024-03-06 MED ORDER — DILTIAZEM HCL ER COATED BEADS 240 MG PO CP24: 240.0000 mg | ORAL_CAPSULE | Freq: Every day | ORAL | 0 refills | Status: DC

## 2024-03-07 DIAGNOSIS — G4730 Sleep apnea, unspecified: Secondary | ICD-10-CM | POA: Diagnosis not present

## 2024-03-14 ENCOUNTER — Other Ambulatory Visit: Payer: Self-pay | Admitting: Student

## 2024-03-14 DIAGNOSIS — Z79899 Other long term (current) drug therapy: Secondary | ICD-10-CM

## 2024-03-14 DIAGNOSIS — I10 Essential (primary) hypertension: Secondary | ICD-10-CM

## 2024-03-14 DIAGNOSIS — E7800 Pure hypercholesterolemia, unspecified: Secondary | ICD-10-CM

## 2024-03-22 ENCOUNTER — Telehealth: Payer: Self-pay | Admitting: Student

## 2024-03-22 DIAGNOSIS — I10 Essential (primary) hypertension: Secondary | ICD-10-CM

## 2024-03-22 DIAGNOSIS — Z79899 Other long term (current) drug therapy: Secondary | ICD-10-CM

## 2024-03-22 DIAGNOSIS — E7800 Pure hypercholesterolemia, unspecified: Secondary | ICD-10-CM

## 2024-03-22 MED ORDER — HYDROCHLOROTHIAZIDE 25 MG PO TABS: 25.0000 mg | ORAL_TABLET | Freq: Every day | ORAL | 1 refills | Status: DC

## 2024-03-22 NOTE — Telephone Encounter (Signed)
 Pt's medication was sent to pt's pharmacy as requested. Confirmation received.

## 2024-03-22 NOTE — Telephone Encounter (Signed)
 Follow Up:     Patient does have an appointment for the first available appointment on 05-02-24. He would like to have enough medicine until 05-02-24 please.

## 2024-03-22 NOTE — Telephone Encounter (Signed)
*  STAT* If patient is at the pharmacy, call can be transferred to refill team.   1. Which medications need to be refilled? (please list name of each medication and dose if known) need a new prescription for Hydrochlorothiazide    2. Would you like to learn more about the convenience, safety, & potential cost savings by using the [facility]?     3. Are you open to using the [facility] (Type [facility]. .   4. Which pharmacy/location (including street and city if local pharmacy) is medication to be sent to? [facility] [address] [place]   5. Do they need a 30 day or 90 day supply?  Whatever amount he can get at this time

## 2024-04-05 ENCOUNTER — Other Ambulatory Visit: Payer: Self-pay | Admitting: Student

## 2024-04-05 ENCOUNTER — Telehealth: Payer: Self-pay | Admitting: Cardiology

## 2024-04-05 DIAGNOSIS — Z79899 Other long term (current) drug therapy: Secondary | ICD-10-CM

## 2024-04-05 DIAGNOSIS — I10 Essential (primary) hypertension: Secondary | ICD-10-CM

## 2024-04-05 DIAGNOSIS — E7800 Pure hypercholesterolemia, unspecified: Secondary | ICD-10-CM

## 2024-04-05 MED ORDER — HYDROCHLOROTHIAZIDE 25 MG PO TABS: 25.0000 mg | ORAL_TABLET | Freq: Every day | ORAL | 1 refills | Status: DC

## 2024-04-05 MED ORDER — ATORVASTATIN CALCIUM 40 MG PO TABS: 40.0000 mg | ORAL_TABLET | Freq: Every day | ORAL | 1 refills | Status: DC

## 2024-04-05 NOTE — Telephone Encounter (Signed)
 RX sent to requested Pharmacy

## 2024-04-05 NOTE — Telephone Encounter (Signed)
*  STAT* If patient is at the pharmacy, call can be transferred to refill team.   1. Which medications need to be refilled? (please list name of each medication and dose if known)   atorvastatin  (LIPITOR) 40 MG tablet  hydrochlorothiazide  (HYDRODIURIL ) 25 MG tablet   2. Would you like to learn more about the convenience, safety, & potential cost savings by using the [facility]?   3. Are you open to using the [facility] (Type [facility]. ).  4. Which pharmacy/location (including street and city if local pharmacy) is medication to be sent to?  [id number] [place], [place] - [address]   5. Do they need a 30 day or 90 day supply?   90 day  Patient is completely out of this medication.  Patient has appointment scheduled on 7/3 with Jeanell Mikes, NP.

## 2024-04-07 DIAGNOSIS — G4730 Sleep apnea, unspecified: Secondary | ICD-10-CM | POA: Diagnosis not present

## 2024-04-15 ENCOUNTER — Other Ambulatory Visit: Payer: Self-pay | Admitting: Student

## 2024-04-15 DIAGNOSIS — Z79899 Other long term (current) drug therapy: Secondary | ICD-10-CM

## 2024-04-15 DIAGNOSIS — I10 Essential (primary) hypertension: Secondary | ICD-10-CM

## 2024-04-15 DIAGNOSIS — E7800 Pure hypercholesterolemia, unspecified: Secondary | ICD-10-CM

## 2024-04-29 ENCOUNTER — Encounter (HOSPITAL_BASED_OUTPATIENT_CLINIC_OR_DEPARTMENT_OTHER): Payer: Self-pay

## 2024-04-30 NOTE — Progress Notes (Unsigned)
 Cardiology Office Note    Date:  05/02/2024  ID:  Matthew Navarro, DOB April 17, 1976, MRN [id number] PCP:  Venancio Pock, PA-C  Cardiologist:  Powell FORBES Sorrow, MD (Inactive)  Electrophysiologist:  None   Chief Complaint: Follow up for CAD   History of Present Illness: .    Matthew Navarro is a 48 y.o. male with visit-pertinent history of nonobstructive CAD per coronary CT, hypertension, hyperlipidemia, GERD.  Patient was initially seen in office by Dr. Sorrow on 11/27/2020 for further evaluation of DOE at the request of her PCP.  At that time patient noted dyspnea on exertion with wheezing and chest pressure while walking inclines that resolved with rest.  Echo showed normal LV function with no significant valvular abnormalities.  Coronary CT showed nonobstructive CAD with a calcium  score of 18.6, 86 percentile with mixed plaque in the proximal LAD less than 50%.  Patient was referred to pulmonology.  His PFTs were normal and cardiopulmonary exercise test showed severe hypertensive response to exercise with mild chronotropic incompetence.  Patient was seen in office by Dr. Sorrow on 12/15/2021, at that time he was doing well, his carvedilol was stopped and he was switched to hydrochlorothiazide .  Patient was last seen in clinic on 12/27/2022 by Barnie Press, NP.  Patient denied any shortness of breath or dyspnea on exertion.  He denied any chest pain.  Patient was very active with strength training 4 to 5 days a week and doing cardio 3 days a week.  It was noted that patient was hypertensive at office visit diltiazem  was increased to 240 mg daily.  Sleep study was also recommended.  Today he presents for follow-up.  He reports that he has been doing very well.  He denies any chest pain, shortness of breath, lower extremity edema, orthopnea or PND.  He denies any palpitations, presyncope or syncope.  Patient reports that he regularly exercises, lifts weights and does 30 minutes of  cardio 4-5 times a week.  He notes that his diet is overall not heart healthy and needs to work on decreasing alcohol intake.  Patient reports that he does not regularly monitor his blood pressure at home, reports that his PCP has recently been managing.  ROS: .   Today he denies chest pain, shortness of breath, lower extremity edema, fatigue, palpitations, melena, hematuria, hemoptysis, diaphoresis, weakness, presyncope, syncope, orthopnea, and PND.  All other systems are reviewed and otherwise negative. Studies Reviewed: SABRA   EKG:  EKG is ordered today, personally reviewed, demonstrating  EKG Interpretation Date/Time:  Thursday May 02 2024 15:51:24 EDT Ventricular Rate:  77 PR Interval:  150 QRS Duration:  86 QT Interval:  390 QTC Calculation: 441 R Axis:   73  Text Interpretation: Normal sinus rhythm Normal ECG Confirmed by Massey Ruhland [phone]) on 05/02/2024 4:04:52 PM   CV Studies: Cardiac studies reviewed are outlined and summarized above. Otherwise please see EMR for full report. Cardiac Studies & Procedures   ______________________________________________________________________________________________     ECHOCARDIOGRAM  ECHOCARDIOGRAM COMPLETE 12/21/2020  Narrative ECHOCARDIOGRAM REPORT    Patient Name:   Matthew Navarro Date of Exam: 12/21/2020 Medical Rec #:  [id number]        Height:       70.0 in Accession #:    [id number]       Weight:       257.0 lb Date of Birth:  [birth year]        BSA:          2.322  m Patient Age:    44 years         BP:           156/102 mmHg Patient Gender: M                HR:           70 bpm. Exam Location:  [address]  Procedure: 2D Echo, Cardiac Doppler and Color Doppler  Indications:    R07.2 Chest pain  History:        Patient has no prior history of Echocardiogram examinations. Signs/Symptoms:Chest Pain; Risk Factors:Hypertension, Dyslipidemia, Obesity and Former Smoker.  Sonographer:    Elsie Bohr RDCS Referring Phys:  [id number] HEATHER E [place]  IMPRESSIONS   1. Left ventricular ejection fraction, by estimation, is 60 to 65%. The left ventricle has normal function. The left ventricle has no regional wall motion abnormalities. Left ventricular diastolic parameters were normal. 2. Right ventricular systolic function is normal. The right ventricular size is normal. Tricuspid regurgitation signal is inadequate for assessing PA pressure. 3. The mitral valve is grossly normal. No evidence of mitral valve regurgitation. No evidence of mitral stenosis. 4. The aortic valve is tricuspid. Aortic valve regurgitation is not visualized. No aortic stenosis is present. 5. The inferior vena cava is normal in size with greater than 50% respiratory variability, suggesting right atrial pressure of 3 mmHg.  Conclusion(s)/Recommendation(s): Normal biventricular function without evidence of hemodynamically significant valvular heart disease.  FINDINGS Left Ventricle: Left ventricular ejection fraction, by estimation, is 60 to 65%. The left ventricle has normal function. The left ventricle has no regional wall motion abnormalities. The left ventricular internal cavity size was normal in size. There is no left ventricular hypertrophy. Left ventricular diastolic parameters were normal. Normal left ventricular filling pressure.  Right Ventricle: The right ventricular size is normal. No increase in right ventricular wall thickness. Right ventricular systolic function is normal. Tricuspid regurgitation signal is inadequate for assessing PA pressure.  Left Atrium: Left atrial size was normal in size.  Right Atrium: Right atrial size was normal in size.  Pericardium: Trivial pericardial effusion is present.  Mitral Valve: The mitral valve is grossly normal. No evidence of mitral valve regurgitation. No evidence of mitral valve stenosis.  Tricuspid Valve: The tricuspid valve is grossly normal. Tricuspid valve regurgitation is not  demonstrated. No evidence of tricuspid stenosis.  Aortic Valve: The aortic valve is tricuspid. Aortic valve regurgitation is not visualized. No aortic stenosis is present.  Pulmonic Valve: The pulmonic valve was grossly normal. Pulmonic valve regurgitation is not visualized. No evidence of pulmonic stenosis.  Aorta: The aortic root and ascending aorta are structurally normal, with no evidence of dilitation.  Venous: The inferior vena cava is normal in size with greater than 50% respiratory variability, suggesting right atrial pressure of 3 mmHg.  IAS/Shunts: The atrial septum is grossly normal.   LEFT VENTRICLE PLAX 2D LVIDd:         5.30 cm  Diastology LVIDs:         3.40 cm  LV e' medial:    6.53 cm/s LV PW:         1.20 cm  LV E/e' medial:  12.2 LV IVS:        1.20 cm  LV e' lateral:   6.96 cm/s LVOT diam:     2.30 cm  LV E/e' lateral: 11.5 LV SV:         71 LV SV Index:   31 LVOT  Area:     4.15 cm   RIGHT VENTRICLE             IVC RV S prime:     16.30 cm/s  IVC diam: 1.20 cm TAPSE (M-mode): 1.7 cm  LEFT ATRIUM             Index       RIGHT ATRIUM           Index LA diam:        4.10 cm 1.77 cm/m  RA Pressure: 3.00 mmHg LA Vol (A2C):   58.0 ml 24.98 ml/m RA Area:     10.40 cm LA Vol (A4C):   58.2 ml 25.06 ml/m RA Volume:   22.30 ml  9.60 ml/m LA Biplane Vol: 58.2 ml 25.06 ml/m AORTIC VALVE LVOT Vmax:   82.50 cm/s LVOT Vmean:  54.200 cm/s LVOT VTI:    0.171 m  AORTA Ao Root diam: 3.50 cm Ao Asc diam:  3.70 cm  MV E velocity: 79.80 cm/s  TRICUSPID VALVE MV A velocity: 67.70 cm/s  Estimated RAP:  3.00 mmHg MV E/A ratio:  1.18 SHUNTS Systemic VTI:  0.17 m Systemic Diam: 2.30 cm  Darryle Decent MD Electronically signed by Darryle Decent MD Signature Date/Time: 12/21/2020/5:29:00 PM    Final      CT SCANS  CT CORONARY MORPH W/CTA COR W/SCORE 12/28/2020  Addendum 12/29/2020  6:18 PM ADDENDUM REPORT: 12/29/2020 18:16  CLINICAL DATA:  Chest  pain  EXAM: Cardiac CTA  MEDICATIONS: Sub lingual nitro. 4mg  x 2  TECHNIQUE: The patient was scanned on a Siemens 192 slice scanner. Gantry rotation speed was 250 msecs. Collimation was 0.6 mm. A 100 kV prospective scan was triggered in the ascending thoracic aorta at 35-75% of the R-R interval. Average HR during the scan was 60 bpm. The 3D data set was interpreted on a dedicated work station using MPR, MIP and VRT modes. A total of 80cc of contrast was used.  FINDINGS: Non-cardiac: See separate report from [facility].  Pulmonary veins drain normally to the left atrium. No LA appendage thrombus.  Calcium  Score: 18.6 Agatston units.  Coronary Arteries: Right dominant with no anomalies  LM: No plaque or stenosis.  LAD system: Mixed plaque proximal LAD, mild (<50%) stenosis.  Circumflex system: No plaque or stenosis.  RCA system: No plaque or stenosis.  IMPRESSION: 1.  Nonobstructive CAD.  2. Coronary artery calcium  score 18.6 Agatston units. This places the patient in the 86th percentile for age and gender.  Dalton Mclean   Electronically Signed By: Ezra Shuck M.D. On: 12/29/2020 18:16  Narrative EXAM: OVER-READ INTERPRETATION  CT CHEST  The following report is an over-read performed by radiologist Dr. Toribio Aye of [facility] on 12/28/2020. This over-read does not include interpretation of cardiac or coronary anatomy or pathology. The coronary calcium  score/coronary CTA interpretation by the cardiologist is attached.  COMPARISON:  None.  FINDINGS: Within the visualized portions of the thorax there are no suspicious appearing pulmonary nodules or masses, there is no acute consolidative airspace disease, no pleural effusions, no pneumothorax and no lymphadenopathy. Visualized portions of the upper abdomen are unremarkable. There are no aggressive appearing lytic or blastic lesions noted in the visualized portions of  the skeleton.  IMPRESSION: 1. No significant incidental noncardiac findings are noted.  Electronically Signed: By: Toribio Aye M.D. On: 12/28/2020 16:17     ______________________________________________________________________________________________       Current Reported Medications:.  Current Meds  Medication Sig   atorvastatin  (LIPITOR) 80 MG tablet Take 1 tablet (80 mg total) by mouth daily.   diltiazem  (CARDIZEM  CD) 300 MG 24 hr capsule Take 300 mg by mouth daily.   EPINEPHrine  0.3 mg/0.3 mL IJ SOAJ injection Inject 0.3 mLs (0.3 mg total) into the muscle once.   Multiple Vitamin (MULTIVITAMIN) capsule Take 1 capsule by mouth daily.   [DISCONTINUED] atorvastatin  (LIPITOR) 40 MG tablet Take 1 tablet (40 mg total) by mouth daily.   [DISCONTINUED] hydrochlorothiazide  (HYDRODIURIL ) 25 MG tablet Take 1 tablet (25 mg total) by mouth daily.    Physical Exam:    VS:  BP (!) 142/84   Pulse 77   Ht 5' 10 (1.778 m)   Wt 243 lb 9.6 oz (110.5 kg)   SpO2 97%   BMI 34.95 kg/m    Wt Readings from Last 3 Encounters:  05/02/24 243 lb 9.6 oz (110.5 kg)  12/27/22 239 lb 12.8 oz (108.8 kg)  12/15/21 240 lb (108.9 kg)    GEN: Well nourished, well developed in no acute distress NECK: No JVD; No carotid bruits CARDIAC: RRR, no murmurs, rubs, gallops RESPIRATORY:  Clear to auscultation without rales, wheezing or rhonchi  ABDOMEN: Soft, non-tender, non-distended EXTREMITIES:  No edema; No acute deformity     Asessement and Plan:.    Nonobstructive CAD: Coronary CT in 12/2020 showed calcium  score of 18.6, mixed plaque in the proximal LAD. Stable with no anginal symptoms. No indication for ischemic evaluation.  Heart healthy diet and regular cardiovascular exercise encouraged.  Continue Lipitor, Cardizem , hydrochlorothiazide  and Aldactone .  Hypertension: Blood pressure today 142/84.  Patient reports that he does not monitor his blood pressure at home.  He reports allergies to a  few antihypertensive medications, on our list only has notes of amlodipine-olmesartan with history of angioedema.  Patient reports that his PCP has been recently managing his antihypertensive resulting in an increase in diltiazem  to 300 mg and spironolactone  to 50 mg daily recently.  Discussed further titrations, patient deferred at this time, in favor of referral to Pharm.D. hypertension clinic.  Encouraged patient to monitor his blood pressure at home and bring blood pressure log to visit.  Continue Cardizem  300 mg daily, hydrochlorothiazide  25 mg daily and spironolactone  50 mg daily.  Hyperlipidemia: Last lipid profile on 12/12/2023 indicated total cholesterol 170, HDL 45, triglycerides 242 and LDL 85.  Will increase Lipitor to 80 mg daily today.  Check fasting lipid profile and LFTs in 6 to 8 weeks.  Patient notes that his diet is not terribly heart healthy, notes that he needs to decrease his alcohol intake.  Heart healthy diet was encouraged, patient is regularly exercising.  OSA: Sleep study in 12/2022 indicated severe obstructive sleep apnea.  Patient reports that he now uses a CPAP, reports adherence.   Disposition: F/u with PharmD HTN clinic, Dr. Barbaraann in 5-6 months to establish care.   Signed, Markis Langland D Maui Britten, NP

## 2024-05-02 ENCOUNTER — Encounter: Payer: Self-pay | Admitting: Cardiology

## 2024-05-02 ENCOUNTER — Ambulatory Visit: Attending: Cardiology | Admitting: Cardiology

## 2024-05-02 VITALS — BP 142/84 | HR 77 | Ht 70.0 in | Wt 243.6 lb

## 2024-05-02 DIAGNOSIS — E7800 Pure hypercholesterolemia, unspecified: Secondary | ICD-10-CM

## 2024-05-02 DIAGNOSIS — G4733 Obstructive sleep apnea (adult) (pediatric): Secondary | ICD-10-CM

## 2024-05-02 DIAGNOSIS — I2510 Atherosclerotic heart disease of native coronary artery without angina pectoris: Secondary | ICD-10-CM | POA: Diagnosis not present

## 2024-05-02 DIAGNOSIS — I10 Essential (primary) hypertension: Secondary | ICD-10-CM

## 2024-05-02 DIAGNOSIS — Z79899 Other long term (current) drug therapy: Secondary | ICD-10-CM

## 2024-05-02 MED ORDER — ATORVASTATIN CALCIUM 80 MG PO TABS
80.0000 mg | ORAL_TABLET | Freq: Every day | ORAL | 3 refills | Status: DC
Start: 2024-05-02 — End: 2024-07-04

## 2024-05-02 MED ORDER — HYDROCHLOROTHIAZIDE 25 MG PO TABS: 25.0000 mg | ORAL_TABLET | Freq: Every day | ORAL | 3 refills | Status: DC

## 2024-05-02 NOTE — Patient Instructions (Addendum)
 Medication Instructions:  Increase Lipitor to 80 mg once a day *If you need a refill on your cardiac medications before your next appointment, please call your pharmacy*  Lab Work: In 6-8 weeks we are going to need you to get a fasting lipid panel and Lft's If you have labs (blood work) drawn today and your tests are completely normal, you will receive your results only by: MyChart Message (if you have MyChart) OR A paper copy in the mail If you have any lab test that is abnormal or we need to change your treatment, we will call you to review the results.  Testing/Procedures: No testing  Follow-Up: At [facility], you and your health needs are our priority.  As part of our continuing mission to provide you with exceptional heart care, our providers are all part of one team.  This team includes your primary Cardiologist (physician) and Advanced Practice Providers or APPs (Physician Assistants and Nurse Practitioners) who all work together to provide you with the care you need, when you need it.  Your next appointment:   5-6 month(s)  Provider:   Darryle Decent MD  Other Appointment: Pharm D Appointment needed  We recommend signing up for the patient portal called MyChart.  Sign up information is provided on this After Visit Summary.  MyChart is used to connect with patients for Virtual Visits (Telemedicine).  Patients are able to view lab/test results, encounter notes, upcoming appointments, etc.  Non-urgent messages can be sent to your provider as well.   To learn more about what you can do with MyChart, go to [email or web address].

## 2024-05-07 DIAGNOSIS — G4730 Sleep apnea, unspecified: Secondary | ICD-10-CM | POA: Diagnosis not present

## 2024-05-28 DIAGNOSIS — I10 Essential (primary) hypertension: Secondary | ICD-10-CM | POA: Diagnosis not present

## 2024-05-28 DIAGNOSIS — G4733 Obstructive sleep apnea (adult) (pediatric): Secondary | ICD-10-CM | POA: Diagnosis not present

## 2024-05-28 DIAGNOSIS — I2510 Atherosclerotic heart disease of native coronary artery without angina pectoris: Secondary | ICD-10-CM | POA: Diagnosis not present

## 2024-05-28 DIAGNOSIS — E7800 Pure hypercholesterolemia, unspecified: Secondary | ICD-10-CM | POA: Diagnosis not present

## 2024-05-29 ENCOUNTER — Ambulatory Visit: Payer: Self-pay | Admitting: Cardiology

## 2024-05-29 DIAGNOSIS — E785 Hyperlipidemia, unspecified: Secondary | ICD-10-CM

## 2024-05-29 LAB — HEPATIC FUNCTION PANEL
ALT: 29 IU/L (ref 0–44)
AST: 29 IU/L (ref 0–40)
Albumin: 4.6 g/dL (ref 4.1–5.1)
Alkaline Phosphatase: 58 IU/L (ref 44–121)
Bilirubin Total: 0.7 mg/dL (ref 0.0–1.2)
Bilirubin, Direct: 0.17 mg/dL (ref 0.00–0.40)
Total Protein: 6.8 g/dL (ref 6.0–8.5)

## 2024-05-29 LAB — LIPID PANEL
Chol/HDL Ratio: 4 ratio (ref 0.0–5.0)
Cholesterol, Total: 162 mg/dL (ref 100–199)
HDL: 41 mg/dL (ref >39)
LDL Chol Calc (NIH): 83 mg/dL (ref 0–99)
Triglycerides: 229 mg/dL — ABNORMAL HIGH (ref 0–149)
VLDL Cholesterol Cal: 38 mg/dL (ref 5–40)

## 2024-05-29 NOTE — Telephone Encounter (Signed)
 Called patient advised of below they verbalized understanding Patient will get labs done when he comes in for appointment with chris pavero

## 2024-05-29 NOTE — Telephone Encounter (Signed)
-----   Message from Katlyn D [place] sent at 05/29/2024  9:52 AM EDT ----- Please let Matthew Navarro know that his triglycerides are elevated and his LDL remains above goal however are improved. His liver function is normal. We increased his Lipitor only 3-4 weeks ago, lab  work is slightly too early to recommend further changes. Recommend rechecking fasting lipid profile and LFTs in 4 weeks.  ----- Message ----- From: Rebecka Memos Lab Results In Sent: 05/28/2024  11:36 PM EDT To: Katlyn D West, NP

## 2024-05-31 DIAGNOSIS — R7309 Other abnormal glucose: Secondary | ICD-10-CM | POA: Diagnosis not present

## 2024-06-02 DIAGNOSIS — L03115 Cellulitis of right lower limb: Secondary | ICD-10-CM | POA: Diagnosis not present

## 2024-06-04 DIAGNOSIS — Z79899 Other long term (current) drug therapy: Secondary | ICD-10-CM | POA: Diagnosis not present

## 2024-06-04 DIAGNOSIS — I10 Essential (primary) hypertension: Secondary | ICD-10-CM | POA: Diagnosis not present

## 2024-06-04 DIAGNOSIS — I700 Atherosclerosis of aorta: Secondary | ICD-10-CM | POA: Diagnosis not present

## 2024-06-04 DIAGNOSIS — R739 Hyperglycemia, unspecified: Secondary | ICD-10-CM | POA: Diagnosis not present

## 2024-06-04 DIAGNOSIS — E785 Hyperlipidemia, unspecified: Secondary | ICD-10-CM | POA: Diagnosis not present

## 2024-06-10 DIAGNOSIS — Z1212 Encounter for screening for malignant neoplasm of rectum: Secondary | ICD-10-CM | POA: Diagnosis not present

## 2024-06-10 DIAGNOSIS — Z1211 Encounter for screening for malignant neoplasm of colon: Secondary | ICD-10-CM | POA: Diagnosis not present

## 2024-06-25 ENCOUNTER — Ambulatory Visit: Attending: Cardiology | Admitting: Pharmacist

## 2024-06-25 ENCOUNTER — Encounter: Payer: Self-pay | Admitting: Pharmacist

## 2024-06-25 ENCOUNTER — Other Ambulatory Visit (HOSPITAL_COMMUNITY): Payer: Self-pay

## 2024-06-25 VITALS — BP 151/110 | HR 71

## 2024-06-25 DIAGNOSIS — I10 Essential (primary) hypertension: Secondary | ICD-10-CM

## 2024-06-25 MED ORDER — CHLORTHALIDONE 25 MG PO TABS
25.0000 mg | ORAL_TABLET | Freq: Every day | ORAL | 1 refills | Status: DC
Start: 2024-06-25 — End: 2024-09-23
  Filled 2024-06-25: qty 90, 90d supply, fill #0

## 2024-06-25 NOTE — Progress Notes (Signed)
 Patient ID: Matthew Navarro                 DOB: 1976/08/28                      MRN: [id number]     HPI: Gwynn Crossley is a 48 y.o. male referred by Katlyn West to HTN clinic. PMH is significant for HTN and possible angioedema to anti hypertensives.  Patienr presents today in good spirits. Has not been checking BP at home however his insurance company mailed him a new device. He has not opened it yet.  Is very physical active. 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 Reports job can be stressful sometimes.  Eats salad every day. Wife cooks vegetables nightly along with chicken or ground [place]. Does not add salt to food.  Patient was previously on olmesartan-amlodipine. A few months into treatment he woke up and his tongue was swollen. Diagnosed with angioedema and med was discontinued,  Current HTN meds:  Spironolactone  50mg  daily Diltiazem  300mng daily Hydrochlorothiazide  25mg  daily  BP goal: <130/80  Wt Readings from Last 3 Encounters:  05/02/24 243 lb 9.6 oz (110.5 kg)  12/27/22 239 lb 12.8 oz (108.8 kg)  12/15/21 240 lb (108.9 kg)   BP Readings from Last 3 Encounters:  06/25/24 (!) 151/110  05/02/24 (!) 142/84  12/27/22 (!) 142/82   Pulse Readings from Last 3 Encounters:  06/25/24 71  05/02/24 77  12/27/22 70    Renal function: CrCl cannot be calculated (Patient's most recent lab result is older than the maximum 21 days allowed.).  Past Medical History:  Diagnosis Date   Angioedema 09/21/2015   BMI 36.0-36.9,adult    DOE (dyspnea on exertion)    after a fall   HTN (hypertension)    Hyperlipidemia    Hypertension     Current Outpatient Medications on File Prior to Visit  Medication Sig Dispense Refill   atorvastatin  (LIPITOR) 80 MG tablet Take 1 tablet (80 mg total) by mouth daily. 90 tablet 3   diltiazem  (CARDIZEM  CD) 300 MG 24 hr capsule Take 300 mg by mouth daily.     EPINEPHrine  0.3 mg/0.3 mL IJ SOAJ injection Inject  0.3 mLs (0.3 mg total) into the muscle once. 4 Device 3   Multiple Vitamin (MULTIVITAMIN) capsule Take 1 capsule by mouth daily.     spironolactone  (ALDACTONE ) 50 MG tablet      No current facility-administered medications on file prior to visit.    Allergies  Allergen Reactions   Amlodipine     Tongue swelling   Amlodipine-Olmesartan     Tongue swelling   Pantoprazole Swelling     Assessment/Plan:  1. Hypertension -  HYPERTENSION CONTROL Vitals:   06/25/24 0957 06/25/24 1006  BP: (!) 155/90 (!) 151/110    The patient's blood pressure is elevated above target today.  In order to address the patient's elevated BP: A current anti-hypertensive medication was adjusted today.    Patient BP 155/90 which is above goal of <130/80. Unclear if his previous reaction was to olmesartan or amlodipine so will avoid ARBs and CCBs for now. Advised next options such as switching thiazide, adding hydralazine, or adding a BB. Will switch hydrochlorothiazide  to chlorthalidone  first and recheck in 4-6 weeks. Recommended begin checking BP at home.  Continue diltiazem  300mg  daily Continue spironolactone  50mg  daily D/c hydrochlorothiazide  Start Chlorthalidone  25mg  daily Recheck in 4-6 weeks  Medford Bolk, PharmD,  BCACP, CDCES, CPP [facility] [address], [place], [place] [address] Phone: [phone]; Fax: [phone] 06/25/2024 5:03 PM

## 2024-06-25 NOTE — Patient Instructions (Addendum)
 Nice meeting you today  We would like your blood pressure to be less than 130/80  Stop your hydrochlorothiazide  and we will start a different medication called chorthalidone 25mg   Continue your spironolactone  and diltiazem   Start checking your blood pressure at home with your new cuff.  Bring your cuff with you to next appointment. I will see you back in a few weeks  Please reach out via mychart or phone with any questions  Medford Bolk, PharmD, BCACP, CDCES, CPP [facility] [address], [place], [place] [address] Phone: [phone]; Fax: [phone] 06/25/2024 10:07 AM

## 2024-06-26 DIAGNOSIS — G4733 Obstructive sleep apnea (adult) (pediatric): Secondary | ICD-10-CM | POA: Diagnosis not present

## 2024-06-27 DIAGNOSIS — E785 Hyperlipidemia, unspecified: Secondary | ICD-10-CM | POA: Diagnosis not present

## 2024-06-28 LAB — LIPID PANEL
Chol/HDL Ratio: 4.2 ratio (ref 0.0–5.0)
Cholesterol, Total: 186 mg/dL (ref 100–199)
HDL: 44 mg/dL (ref >39)
LDL Chol Calc (NIH): 92 mg/dL (ref 0–99)
Triglycerides: 303 mg/dL — ABNORMAL HIGH (ref 0–149)
VLDL Cholesterol Cal: 50 mg/dL — ABNORMAL HIGH (ref 5–40)

## 2024-06-28 LAB — HEPATIC FUNCTION PANEL
ALT: 69 IU/L — ABNORMAL HIGH (ref 0–44)
AST: 46 IU/L — ABNORMAL HIGH (ref 0–40)
Albumin: 4.8 g/dL (ref 4.1–5.1)
Alkaline Phosphatase: 75 IU/L (ref 44–121)
Bilirubin Total: 0.4 mg/dL (ref 0.0–1.2)
Bilirubin, Direct: 0.16 mg/dL (ref 0.00–0.40)
Total Protein: 7.2 g/dL (ref 6.0–8.5)

## 2024-07-01 DIAGNOSIS — R7309 Other abnormal glucose: Secondary | ICD-10-CM | POA: Diagnosis not present

## 2024-07-04 MED ORDER — ATORVASTATIN CALCIUM 40 MG PO TABS: 40.0000 mg | ORAL_TABLET | Freq: Every day | ORAL | 3 refills | Status: AC

## 2024-07-04 NOTE — Addendum Note (Signed)
 Addended by: GRETEL MAEOLA CROME on: 07/04/2024 08:03 AM   Modules accepted: Orders

## 2024-07-26 DIAGNOSIS — G4733 Obstructive sleep apnea (adult) (pediatric): Secondary | ICD-10-CM | POA: Diagnosis not present

## 2024-07-30 ENCOUNTER — Emergency Department (HOSPITAL_COMMUNITY)

## 2024-07-30 ENCOUNTER — Encounter (HOSPITAL_COMMUNITY): Payer: Self-pay | Admitting: Emergency Medicine

## 2024-07-30 ENCOUNTER — Other Ambulatory Visit: Payer: Self-pay

## 2024-07-30 ENCOUNTER — Emergency Department (HOSPITAL_COMMUNITY)
Admission: EM | Admit: 2024-07-30 | Discharge: 2024-07-30 | Disposition: A | Discharge status: Home / Self Care | Attending: Emergency Medicine | Admitting: Emergency Medicine

## 2024-07-30 DIAGNOSIS — M7989 Other specified soft tissue disorders: Secondary | ICD-10-CM

## 2024-07-30 DIAGNOSIS — L0390 Cellulitis, unspecified: Secondary | ICD-10-CM | POA: Diagnosis not present

## 2024-07-30 DIAGNOSIS — D72829 Elevated white blood cell count, unspecified: Secondary | ICD-10-CM | POA: Insufficient documentation

## 2024-07-30 DIAGNOSIS — M79604 Pain in right leg: Secondary | ICD-10-CM | POA: Diagnosis present

## 2024-07-30 DIAGNOSIS — L03115 Cellulitis of right lower limb: Secondary | ICD-10-CM | POA: Diagnosis not present

## 2024-07-30 DIAGNOSIS — I8001 Phlebitis and thrombophlebitis of superficial vessels of right lower extremity: Secondary | ICD-10-CM | POA: Insufficient documentation

## 2024-07-30 LAB — COMPREHENSIVE METABOLIC PANEL WITH GFR
ALT: 40 U/L (ref 0–44)
AST: 33 U/L (ref 15–41)
Albumin: 4.9 g/dL (ref 3.5–5.0)
Alkaline Phosphatase: 74 U/L (ref 38–126)
Anion gap: 16 — ABNORMAL HIGH (ref 5–15)
BUN: 21 mg/dL — ABNORMAL HIGH (ref 6–20)
CO2: 24 mmol/L (ref 22–32)
Calcium: 10 mg/dL (ref 8.9–10.3)
Chloride: 95 mmol/L — ABNORMAL LOW (ref 98–111)
Creatinine, Ser: 1.05 mg/dL (ref 0.61–1.24)
GFR, Estimated: >60 mL/min (ref >60)
Glucose, Bld: 109 mg/dL — ABNORMAL HIGH (ref 70–99)
Potassium: 3.6 mmol/L (ref 3.5–5.1)
Sodium: 134 mmol/L — ABNORMAL LOW (ref 135–145)
Total Bilirubin: 1.3 mg/dL — ABNORMAL HIGH (ref 0.0–1.2)
Total Protein: 7.8 g/dL (ref 6.5–8.1)

## 2024-07-30 LAB — CBC WITH DIFFERENTIAL/PLATELET
Abs Immature Granulocytes: 0.04 K/uL (ref 0.00–0.07)
Basophils Absolute: 0 K/uL (ref 0.0–0.1)
Basophils Relative: 0 %
Eosinophils Absolute: 0 K/uL (ref 0.0–0.5)
Eosinophils Relative: 0 %
HCT: 46.2 % (ref 39.0–52.0)
Hemoglobin: 15.8 g/dL (ref 13.0–17.0)
Immature Granulocytes: 0 %
Lymphocytes Relative: 9 %
Lymphs Abs: 1.1 K/uL (ref 0.7–4.0)
MCH: 29.8 pg (ref 26.0–34.0)
MCHC: 34.2 g/dL (ref 30.0–36.0)
MCV: 87.2 fL (ref 80.0–100.0)
Monocytes Absolute: 0.6 K/uL (ref 0.1–1.0)
Monocytes Relative: 5 %
Neutro Abs: 10.3 K/uL — ABNORMAL HIGH (ref 1.7–7.7)
Neutrophils Relative %: 86 %
Platelets: 160 K/uL (ref 150–400)
RBC: 5.3 MIL/uL (ref 4.22–5.81)
RDW: 12.2 % (ref 11.5–15.5)
WBC: 12 K/uL — ABNORMAL HIGH (ref 4.0–10.5)
nRBC: 0 % (ref 0.0–0.2)

## 2024-07-30 MED ORDER — DOXYCYCLINE HYCLATE 100 MG PO CAPS: 100.0000 mg | ORAL_CAPSULE | Freq: Two times a day (BID) | ORAL | 0 refills | Status: AC

## 2024-07-30 NOTE — ED Provider Notes (Signed)
 [place] EMERGENCY DEPARTMENT AT [facility] Provider Note   CSN: [id number] Arrival date & time: 07/30/24  1347     Patient presents with: Leg Pain   Matthew Navarro is a 48 y.o. male resents emergency department for evaluation of erythema of the right lower extremity.  He has a past medical history of chronic edema of the lower extremities which is worse after he was discontinued on hydrochlorothiazide , hypertension.  This is the second time he has had this erythematous develop in his right leg.  He was seen in urgent care previously and treated with antibiotics with resolution.  He states that his leg swelling began after they discontinued the hydrochlorothiazide  and this is when this erythematous area developed the first and now second time.  He denies any significant pain fever or chills.    Leg Pain      Prior to Admission medications   Medication Sig Start Date End Date Taking? Authorizing Provider  atorvastatin  (LIPITOR) 40 MG tablet Take 1 tablet (40 mg total) by mouth daily. 07/04/24 10/02/24  West, Katlyn D, NP  chlorthalidone  (HYGROTON ) 25 MG tablet Take 1 tablet (25 mg total) by mouth daily. 06/25/24 09/23/24  Pavero, Lonni, RPH  diltiazem  (CARDIZEM  CD) 300 MG 24 hr capsule Take 300 mg by mouth daily. 03/04/24   [provider]  EPINEPHrine  0.3 mg/0.3 mL IJ SOAJ injection Inject 0.3 mLs (0.3 mg total) into the muscle once. 09/21/15   Kozlow, Camellia PARAS, MD  Multiple Vitamin (MULTIVITAMIN) capsule Take 1 capsule by mouth daily.    [provider]  spironolactone  (ALDACTONE ) 50 MG tablet     [provider]    Allergies: Amlodipine, Amlodipine-olmesartan, and Pantoprazole    Review of Systems  Updated Vital Signs BP (!) 175/101 (BP Location: Left Arm)   Pulse 85   Temp 98.9 F (37.2 C) (Oral)   Resp 16   SpO2 98%   Physical Exam Vitals and nursing note reviewed.  Constitutional:      General: He is not in acute distress.     Appearance: He is well-developed. He is not diaphoretic.  HENT:     Head: Normocephalic and atraumatic.  Eyes:     General: No scleral icterus.    Conjunctiva/sclera: Conjunctivae normal.  Cardiovascular:     Rate and Rhythm: Normal rate and regular rhythm.     Heart sounds: Normal heart sounds.  Pulmonary:     Effort: Pulmonary effort is normal. No respiratory distress.     Breath sounds: Normal breath sounds.  Abdominal:     Palpations: Abdomen is soft.     Tenderness: There is no abdominal tenderness.  Musculoskeletal:     Cervical back: Normal range of motion and neck supple.     Right lower leg: Edema present.     Left lower leg: Edema present.     Comments: Right greater than left lower extremity edema with erythema over the anterior and medial side of the leg, noncircumferential, irregularly shaped border  Skin:    General: Skin is warm and dry.  Neurological:     Mental Status: He is alert.  Psychiatric:        Behavior: Behavior normal.     (all labs ordered are listed, but only abnormal results are displayed) Labs Reviewed  CBC WITH DIFFERENTIAL/PLATELET - Abnormal; Notable for the following components:      Result Value   WBC 12.0 (*)    Neutro Abs 10.3 (*)  All other components within normal limits  COMPREHENSIVE METABOLIC PANEL WITH GFR - Abnormal; Notable for the following components:   Sodium 134 (*)    Chloride 95 (*)    Glucose, Bld 109 (*)    BUN 21 (*)    Total Bilirubin 1.3 (*)    Anion gap 16 (*)    All other components within normal limits    EKG: None  Radiology: VAS US  LOWER EXTREMITY VENOUS (DVT) (ONLY [facility] & [facility]) Result Date: 07/30/2024  Lower Venous DVT Study Patient Name:  Matthew Navarro  Date of Exam:   07/30/2024 Medical Rec #: [id number]         Accession #:    [id number] Date of Birth: May 24, 1976         Patient Gender: M Patient Age:   49 years Exam Location:  [facility] Procedure:      VAS US  LOWER EXTREMITY VENOUS (DVT)  Referring Phys: Briza Bark --------------------------------------------------------------------------------  Indications: Swelling, Edema, and Erythema.  Comparison Study: No prior exam. Performing Technologist: Edilia Elden Appl  Examination Guidelines: A complete evaluation includes B-mode imaging, spectral Doppler, color Doppler, and power Doppler as needed of all accessible portions of each vessel. Bilateral testing is considered an integral part of a complete examination. Limited examinations for reoccurring indications may be performed as noted. The reflux portion of the exam is performed with the patient in reverse Trendelenburg.  +---------+---------------+---------+-----------+----------+--------------+ RIGHT    CompressibilityPhasicitySpontaneityPropertiesThrombus Aging +---------+---------------+---------+-----------+----------+--------------+ CFV      Full                                                        +---------+---------------+---------+-----------+----------+--------------+ SFJ      Full                                                        +---------+---------------+---------+-----------+----------+--------------+ FV Prox  Full                                                        +---------+---------------+---------+-----------+----------+--------------+ FV Mid   Full                                                        +---------+---------------+---------+-----------+----------+--------------+ FV DistalFull                                                        +---------+---------------+---------+-----------+----------+--------------+ PFV      Full                                                        +---------+---------------+---------+-----------+----------+--------------+  POP      Full                                                        +---------+---------------+---------+-----------+----------+--------------+ PTV       Full                                                        +---------+---------------+---------+-----------+----------+--------------+ PERO     Full                                                        +---------+---------------+---------+-----------+----------+--------------+ SSV      Partial        Yes      Yes                  Chronic        +---------+---------------+---------+-----------+----------+--------------+ Focal chronic calcified non-occlusive superficial vein thrombosis noted in the proximal right small saphenous vein.  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  - There is no evidence of deep vein thrombosis in the lower extremity.  - No cystic structure found in the popliteal fossa.  LEFT: - No evidence of common femoral vein obstruction.   *See table(s) above for measurements and observations.    Preliminary      Procedures   Medications Ordered in the ED - No data to display  Clinical Course as of 07/30/24 1805  Tue Jul 30, 2024  1753 WBC(!): 12.0 [AH]  1802 I reviewed the patient's labs.  White count is a slight the elevated.  I visualized interpreted vascular ultrasound of the right lower extremity which shows no DVT but does show chronic appearing superficial thrombus in the  distal saphenous vein. [AH]    Clinical Course User Index [AH] Arloa Chroman, PA-C                                  Medical Decision Making Amount and/or Complexity of Data Reviewed Labs: ordered. Decision-making details documented in ED Course.  Risk Prescription drug management.   Patient here with right leg pain and redness, differential diagnosis includes cellulitis, DVT, stasis dermatitis.  He has bilateral lower extremity swelling. I reviewed the patient's labs  she has mildly elevated white blood cell count 12,000, CMP shows mildly elevated blood glucose of insignificant value.  I visualized and interpreted vascular ultrasound of the right lower extremity which shows a super superficial chronic thrombosis of the right small saphenous vein at the distal leg in the area of the patient's discomfort. Patient will be treated symptomatically and also given treatment for suspected overlying cellulitis.     Final diagnoses:  None    ED Discharge Orders     None          Arloa Chroman, PA-C 08/03/24 0018    Bari Roxie HERO, DO 08/16/24 1614

## 2024-07-30 NOTE — ED Triage Notes (Signed)
 Pt reports right lower leg swelling and redness that started last night. PT reports night sweats and fatigue.

## 2024-07-30 NOTE — Discharge Instructions (Addendum)
 You were DVT study showed no deep vein thrombosis which would put you at risk for pulmonary embolus.  You do have a small chronic appearing clot in the distal saphenous vein which is a small superficial vein in the lower part of your leg in the area where you are having redness.  This is likely the cause of this recurrent redness.  Treatment includes elevating compressing and applying heat.  There are many causes of thrombophlebitis which are discussed in the attachment.  You also had a slightly elevated white blood cell count given the fact that the redness of the lower leg is significant I am going to treat you again for potential recurrent cellulitis as well with oral doxycycline.  Make sure to take this twice daily with food and plenty of water.  Avoid a lot of excessive sunlight if you are going on vacation..  Please follow-up with your primary care physician regarding your diagnosis.  You do not need to be on any specific blood thinners for treatment.  If you are having worsening condition such as global swelling of the extremity, severe pain, shaking chills, chest pain, shortness of breath, loss of consciousness return to the emergency department for emergent evaluation.

## 2024-07-31 DIAGNOSIS — R7309 Other abnormal glucose: Secondary | ICD-10-CM | POA: Diagnosis not present

## 2024-08-08 ENCOUNTER — Ambulatory Visit: Attending: Cardiovascular Disease | Admitting: Pharmacist

## 2024-08-08 ENCOUNTER — Encounter: Payer: Self-pay | Admitting: Pharmacist

## 2024-08-08 VITALS — BP 168/111 | HR 79

## 2024-08-08 DIAGNOSIS — I10 Essential (primary) hypertension: Secondary | ICD-10-CM

## 2024-08-08 MED ORDER — CARVEDILOL 6.25 MG PO TABS: 6.2500 mg | ORAL_TABLET | Freq: Two times a day (BID) | ORAL | 1 refills | Status: DC

## 2024-08-08 NOTE — Assessment & Plan Note (Signed)
 Assessment: BP is uncontrolled in office BP 159/98 mmHg above the goal (<130/80). Denies SOB, palpitation, chest pain, headaches Tolerates diltiazem , spironolactone  and chlorthalidone  well without any side effects Currently on maximized dose of spironolactone  and chlorthalidone  Discussed with patient that other first line agents (ACEi/ARB/dihydropyridine CCBs) are not suitable due to prior ADR to ARBs/CCBs Patient is willing to try BB at this time Reiterated the importance of regular exercise and low salt diet   Plan:  Start taking carvedilol 6.25 mg twice daily Continue taking spironolactone  50 mg daily, diltiazem  240 mg daily, and chlorthalidone  25 mg daily Patient to keep record of BP readings with heart rate and report to us  at the next visit Patient to see PharmD in 4 weeks for follow up and call if any questions/concerns before next appointment

## 2024-08-08 NOTE — Progress Notes (Signed)
 Patient ID: Matthew Navarro                 DOB: 1976/02/28                      MRN: [id number]     HPI: Manfred Laspina is a 48 y.o. male referred by Katlyn West to HTN clinic. PMH is significant for HTN and possible angioedema to anti hypertensives.  Patient presents today in good spirits. He received a new BP machine and has been checking his BP daily. He reports no BP readings at goal (<130/80). His home BP readings ranging (155-160)/(89-99). He denies any SOB, headache, or chest pain. He reports tolerating HTN meds well without any side effects. Although, he hasn't seen much benefit in BP since adding on chlorthalidone  at last visit.   Of note, he had a ER visit last week where he presented with lower leg swelling, chills and slight fever. He was found to have a super superficial chronic thrombosis of the right small saphenous vein at the distal leg. He said he was given an antibiotic as it was no concerns for DVT.   He is very active. Works out 4x a week.   He is considering GLP-1 to support weight loss and potentially aid in blood pressure control. He is unclear regarding lack of BP improvement despite current therapy and adding on HTN medications.  Eats salad every day. Wife cooks vegetables nightly along with chicken or ground [place]. Does not add salt to food.  Current HTN meds:  - Spironolactone  50mg  daily - Diltiazem  240 mg daily (Patient states that he felt better when he was on 240 mg dose so he called a     provider decreased his dose from 300 mg to 240 mg) - Chlorthalidone  25mg  daily   CMP (07/30/24) since switching to chlorthalidone : Na 134, K 3.6, Cl 95, Scr 1.05, Crcl: 108 ml/min  BP goal: <130/80  Wt Readings from Last 3 Encounters:  05/02/24 243 lb 9.6 oz (110.5 kg)  12/27/22 239 lb 12.8 oz (108.8 kg)  12/15/21 240 lb (108.9 kg)   BP Readings from Last 3 Encounters:  08/08/24 (!) 168/111  07/30/24 (!) 185/128  06/25/24 (!) 151/110   Pulse Readings from Last  3 Encounters:  08/08/24 79  07/30/24 88  06/25/24 71    Renal function: CrCl cannot be calculated (Unknown ideal weight.).  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 90 tablet 3   chlorthalidone  (HYGROTON ) 25 MG tablet Take 1 tablet (25 mg total) by mouth daily. 90 tablet 1   diltiazem  (CARDIZEM  CD) 300 MG 24 hr capsule Take 300 mg by mouth daily.     doxycycline (VIBRAMYCIN) 100 MG capsule Take 1 capsule (100 mg total) by mouth 2 (two) times daily. 20 capsule 0   EPINEPHrine  0.3 mg/0.3 mL IJ SOAJ injection Inject 0.3 mLs (0.3 mg total) into the muscle once. 4 Device 3   Multiple Vitamin (MULTIVITAMIN) capsule Take 1 capsule by mouth daily.     spironolactone  (ALDACTONE ) 50 MG tablet      No current facility-administered  medications on file prior to visit.    Allergies  Allergen Reactions   Amlodipine     Tongue swelling   Amlodipine-Olmesartan     Tongue swelling   Pantoprazole Swelling    Assessment: BP is uncontrolled in office BP 159/98 mmHg above the goal (<130/80). Denies SOB, palpitation, chest pain, headaches Tolerates diltiazem , spironolactone  and chlorthalidone  well without any side effects Currently on maximized dose of spironolactone  and chlorthalidone  Discussed with patient that other first line agents (ACEi/ARB/dihydropyridine CCBs) are not suitable due to prior ADR to ARBs/CCBs Patient is willing to try BB at this time Reiterated the importance of regular exercise and low salt diet    Plan:  Start taking carvedilol 6.25 mg twice daily Continue taking spironolactone  50 mg daily, diltiazem  240 mg daily, and chlorthalidone  25 mg daily Patient  to keep record of BP readings with heart rate and report to us  at the next visit Patient to see PharmD in 4 weeks for follow up and call if any questions/concerns before next appointment

## 2024-08-08 NOTE — Patient Instructions (Addendum)
 Changes made by your pharmacist Desiray Orchard E. Fruma Africa, PharmD at today's visit:   Instructions/Changes  (what do you need to do) Your Notes  (what you did and when you did it)  Begin carvedilol 6.25 mg twice daily   2. Continue Chlorthalidone  25mg  daily, spironolactone  50mg  daily, diltiazem  240 mg daily    Bring all of your meds, your BP cuff and your record of home blood pressures to your next appointment.    HOW TO TAKE YOUR BLOOD PRESSURE AT HOME  Rest 5 minutes before taking your blood pressure.  Don't smoke or drink caffeinated beverages for at least 30 minutes before. Take your blood pressure before (not after) you eat. Sit comfortably with your back supported and both feet on the floor (don't cross your legs). Elevate your arm to heart level on a table or a desk. Use the proper sized cuff. It should fit smoothly and snugly around your bare upper arm. There should be enough room to slip a fingertip under the cuff. The bottom edge of the cuff should be 1 inch above the crease of the elbow. Ideally, take 3 measurements at one sitting and record the average.  Important lifestyle changes to control high blood pressure  Intervention  Effect on the BP  Lose extra pounds and watch your waistline Weight loss is one of the most effective lifestyle changes for controlling blood pressure. If you're overweight or obese, losing even a small amount of weight can help reduce blood pressure. Blood pressure might go down by about 1 millimeter of mercury (mm Hg) with each kilogram (about 2.2 pounds) of weight lost.  Exercise regularly As a general goal, aim for at least 30 minutes of moderate physical activity every day. Regular physical activity can lower high blood pressure by about 5 to 8 mm Hg.  Eat a healthy diet Eating a diet rich in whole grains, fruits, vegetables, and low-fat dairy products and low in saturated fat and cholesterol. A healthy diet can lower high blood pressure by up to 11 mm Hg.   Reduce salt (sodium) in your diet Even a small reduction of sodium in the diet can improve heart health and reduce high blood pressure by about 5 to 6 mm Hg.  Limit alcohol One drink equals 12 ounces of beer, 5 ounces of wine, or 1.5 ounces of 80-proof liquor.  Limiting alcohol to less than one drink a day for women or two drinks a day for men can help lower blood pressure by about 4 mm Hg.   If you have any questions or concerns please use My Chart to send questions or call the office at [phone]

## 2024-08-26 DIAGNOSIS — G4730 Sleep apnea, unspecified: Secondary | ICD-10-CM | POA: Diagnosis not present

## 2024-08-26 DIAGNOSIS — G4733 Obstructive sleep apnea (adult) (pediatric): Secondary | ICD-10-CM | POA: Diagnosis not present

## 2024-08-27 DIAGNOSIS — R52 Pain, unspecified: Secondary | ICD-10-CM | POA: Diagnosis not present

## 2024-08-27 DIAGNOSIS — M7522 Bicipital tendinitis, left shoulder: Secondary | ICD-10-CM | POA: Diagnosis not present

## 2024-08-29 DIAGNOSIS — M25512 Pain in left shoulder: Secondary | ICD-10-CM | POA: Diagnosis not present

## 2024-08-29 DIAGNOSIS — G8929 Other chronic pain: Secondary | ICD-10-CM | POA: Diagnosis not present

## 2024-08-31 DIAGNOSIS — R7309 Other abnormal glucose: Secondary | ICD-10-CM | POA: Diagnosis not present

## 2024-09-05 ENCOUNTER — Other Ambulatory Visit: Payer: Self-pay | Admitting: Cardiovascular Disease

## 2024-09-23 ENCOUNTER — Telehealth: Payer: Self-pay | Admitting: Student

## 2024-09-23 DIAGNOSIS — I10 Essential (primary) hypertension: Secondary | ICD-10-CM

## 2024-09-23 MED ORDER — CHLORTHALIDONE 25 MG PO TABS: 25.0000 mg | ORAL_TABLET | Freq: Every day | ORAL | 2 refills | Status: AC

## 2024-09-23 NOTE — Telephone Encounter (Signed)
*  STAT* If patient is at the pharmacy, call can be transferred to refill team.   1. Which medications need to be refilled? (please list name of each medication and dose if known) chlorthalidone  (HYGROTON ) 25 MG tablet  Take 1 tablet (25 mg total) by mouth daily.   2. Would you like to learn more about the convenience, safety, & potential cost savings by using the [facility]? No   3. Are you open to using the [facility] No   4. Which pharmacy/location (including street and city if local pharmacy) is medication to be sent to?[id number] - [place], [place] - [address]    5. Do they need a 30 day or 90 day supply? 90 Day Supply  Pt is currently out of medication

## 2024-09-23 NOTE — Telephone Encounter (Signed)
 Pt's medication was sent to pt's pharmacy as requested. Confirmation received.

## 2024-09-24 ENCOUNTER — Other Ambulatory Visit (HOSPITAL_COMMUNITY): Payer: Self-pay

## 2024-09-24 ENCOUNTER — Ambulatory Visit

## 2024-09-24 VITALS — BP 135/92 | HR 95

## 2024-09-24 DIAGNOSIS — I10 Essential (primary) hypertension: Secondary | ICD-10-CM

## 2024-09-24 MED ORDER — OMRON 3 SERIES BP MONITOR DEVI
1.0000 | Freq: Every day | 0 refills | Status: AC
  Filled 2024-09-24: qty 1, 30d supply, fill #0

## 2024-09-24 NOTE — Progress Notes (Signed)
 Patient ID: Matthew Navarro                 DOB: 19-Aug-1976                      MRN: [id number]     HPI: Matthew Navarro is a 48 y.o. male referred by Matthew Navarro to HTN clinic. 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 Previously on olmesartan-amlodipine back in 2016; developed tongue swelling after several months of taking therapy, diagnosed as angioedema, medication discontinued.  The patient presents today for a PharmD hypertension follow-up visit. He was last seen by PharmD in October 2025, where carvedilol  was initiated due to uncontrolled blood pressure on his HTN regimen. He reports adherence to spironolactone  50 mg daily, diltiazem  300 mg daily, and chlorthalidone  25 mg daily and taking as prescribed. He notes that his diltiazem  dose is now 300 mg, previously reported as 240 mg at the last PharmD visit, which patient states was titrated by his PCP. He was advised to bring his medication bottles to the next visit for verification. The patient has been taking carvedilol  once daily instead of twice daily, as prescribed, stating he was unaware and assumed all his medications were once daily. He has been checking his blood pressure regularly and brought a log today showing average readings of 148/92 mmHg with a heart rate of 68 bpm. He denies any SOB, chest pain or headache.  He presents with his new blood pressure cuff (non Omron brand) obtained through his insurance to have validated. Cuff was found to be inaccurate after comparing to office BP monitor, SBP and DBP read > 10 points higher than office BP. We discussed more accurate BP monitor brands, and patient agreeable to pick up Omron BP cuff from pharmacy downstairs.  Home BP monitors readings: - 154/100 HR 72 - 151/101 HR 75  Office BP monitor readings  - 134/84 HR 96 - 135/92 HR 95    Home BP readings from past few weeks: 157/99  73  157/95 69  156/97 71  148/93 63  142/95 65   138/88 67  157/98 67  146/89 68  154/88 67  139/85 67  148/94 74  Average:148/92   Average:  6 8   He is very active. Works out 4x a week.   Eats salad every day. Matthew Navarro cooks vegetables nightly along with chicken or ground turkey. Does not add salt to food.  Current HTN meds: spironolactone  50mg  daily, diltiazem  300 mg daily, chlorthalidone  25mg  daily, and carvedilol  6.25 mg twice daily  CMP (07/30/24): Na 134, K 3.6, Cl 95, Scr 1.05, Crcl: 108 ml/min  BP goal: <130/80  Wt Readings from Last 3 Encounters:  05/02/24 243 lb 9.6 oz (110.5 kg)  12/27/22 239 lb 12.8 oz (108.8 kg)  12/15/21 240 lb (108.9 kg)   BP Readings from Last 3 Encounters:  08/08/24 (!) 168/111  07/30/24 (!) 185/128  06/25/24 (!) 151/110   Pulse Readings from Last 3 Encounters:  08/08/24 79  07/30/24 88  06/25/24 71    Renal function: CrCl cannot be calculated (Patient's most recent lab result is older than the maximum 21 days allowed.).  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 90 tablet 3   carvedilol  (COREG ) 6.25 MG tablet Take 1 tablet (6.25 mg total) by mouth 2 (two) times daily. 60 tablet 1   chlorthalidone  (HYGROTON ) 25 MG tablet Take 1 tablet (25 mg total) by mouth daily. 90 tablet 2   diltiazem  (CARDIZEM  CD) 240 MG 24 hr capsule Take 240 mg by mouth daily.     doxycycline  (VIBRAMYCIN ) 100 MG capsule Take 1 capsule (100 mg total) by mouth 2 (two) times daily. 20 capsule 0   EPINEPHrine  0.3 mg/0.3 mL IJ SOAJ injection Inject 0.3 mLs (0.3 mg total) into the muscle once. 4 Device 3   Multiple Vitamin (MULTIVITAMIN) capsule Take 1 capsule by mouth daily.     spironolactone   (ALDACTONE ) 50 MG tablet      No current facility-administered medications on file prior to visit.    Allergies  Allergen Reactions   Amlodipine     Tongue swelling   Amlodipine-Olmesartan     Tongue swelling   Pantoprazole Swelling    Assessment: BP in office is 135/92 above the target (<130/80 mmHg) but has improved compared to the previous office reading of 168/111 mmHg following the addition of carvedilol . Home BP averages around 148/92 mmHg with a heart rate of 68 bpm; however, the home monitor appears inaccurate, consistently reading >10 points higher than office measurements. Patient denies shortness of breath, palpitations, chest pain, or headaches. Patient tolerates diltiazem , spironolactone , carvedilol , and chlorthalidone  without adverse effects. Patient is taking carvedilol  only once daily instead of the prescribed twice-daily regimen. Discussed that other first-line agents (ACE inhibitors, ARBs, dihydropyridine CCBs) are not appropriate due to potential angioedema risk with ARB-CCB combination. Reinforced the importance of regular physical activity and adherence to a low-sodium diet.   Plan:  Begin taking carvedilol  6.25 mg twice daily in hopes to achieve goal (< 130/80 mmHg); patient is currently close to goal  Continue taking spironolactone  50 mg daily, diltiazem  300 mg daily, and chlorthalidone  25 mg daily Pick up Omron series 3 BP monitor and keep record of BP readings with heart rate and report to us  at the next visit Patient to bring medications next visit and BP monitor to next visit to confirm accurancy Patient has appointment with Dr. Barbaraann on 10/23/2024. Will have him f/u with PharmD ~ 6 weeks    Matthew Navarro, Pharm.D [place] Matthew Navarro. [facility] [address], [place], [place] [address] Phone: [phone]; Fax: [phone]

## 2024-09-24 NOTE — Patient Instructions (Addendum)
 Changes made by your pharmacist Koltyn Kelsay E. Waverly Chavarria, PharmD at today's visit:    Instructions/Changes  (what do you need to do) Your Notes  (what you did and when you did it)  Begin taking carvedilol  6.25 mg twice daily   2. Continue spironolactone  50mg  daily, diltiazem  300 mg daily, chlorthalidone  25mg  daily   3. PharmD follow up appointment Jan 13th at 1:30   4. Please check your blood pressure ~2 hours after taking your medication. Also check your heart rate as well.     Bring all of your meds, your BP cuff and your record of home blood pressures to your next appointment.    HOW TO TAKE YOUR BLOOD PRESSURE AT HOME  Rest 5 minutes before taking your blood pressure.  Don't smoke or drink caffeinated beverages for at least 30 minutes before. Take your blood pressure before (not after) you eat. Sit comfortably with your back supported and both feet on the floor (don't cross your legs). Elevate your arm to heart level on a table or a desk. Use the proper sized cuff. It should fit smoothly and snugly around your bare upper arm. There should be enough room to slip a fingertip under the cuff. The bottom edge of the cuff should be 1 inch above the crease of the elbow. Ideally, take 3 measurements at one sitting and record the average.  Important lifestyle changes to control high blood pressure  Intervention  Effect on the BP  Lose extra pounds and watch your waistline Weight loss is one of the most effective lifestyle changes for controlling blood pressure. If you're overweight or obese, losing even a small amount of weight can help reduce blood pressure. Blood pressure might go down by about 1 millimeter of mercury (mm Hg) with each kilogram (about 2.2 pounds) of weight lost.  Exercise regularly As a general goal, aim for at least 30 minutes of moderate physical activity every day. Regular physical activity can lower high blood pressure by about 5 to 8 mm Hg.  Eat a healthy diet Eating a diet  rich in whole grains, fruits, vegetables, and low-fat dairy products and low in saturated fat and cholesterol. A healthy diet can lower high blood pressure by up to 11 mm Hg.  Reduce salt (sodium) in your diet Even a small reduction of sodium in the diet can improve heart health and reduce high blood pressure by about 5 to 6 mm Hg.  Limit alcohol One drink equals 12 ounces of beer, 5 ounces of wine, or 1.5 ounces of 80-proof liquor.  Limiting alcohol to less than one drink a day for women or two drinks a day for men can help lower blood pressure by about 4 mm Hg.   If you have any questions or concerns please use My Chart to send questions or call the office at [phone]

## 2024-10-09 ENCOUNTER — Other Ambulatory Visit: Payer: Self-pay | Admitting: Cardiovascular Disease

## 2024-10-18 NOTE — Progress Notes (Deleted)
" °  Cardiology Office Note:  .   Date:  10/18/2024  ID:  Matthew Pruiett, DOB [birth year], MRN [id number] PCP: Venancio Glenis RIGGERS  [facility] Providers Cardiologist:  Powell FORBES Sorrow, MD (Inactive)   History of Present Illness: .   No chief complaint on file.   Matthew Navarro is a 48 y.o. male with below history who presents for follow-up.   History of Present Illness               Problem List CAD -CAC 18.6 (86th percentile) -mild disease <50% 2. HLD -T chol 186, HDL 44, LDL 92, TG 303 3. HTN    ROS: All other ROS reviewed and negative. Pertinent positives noted in the HPI.     Studies Reviewed: SABRA       CCTA 12/29/2020 IMPRESSION: 1.  Nonobstructive CAD.   2. Coronary artery calcium  score 18.6 Agatston units. This places the patient in the 86th percentile for age and gender. Physical Exam:   VS:  There were no vitals taken for this visit.   Wt Readings from Last 3 Encounters:  05/02/24 243 lb 9.6 oz (110.5 kg)  12/27/22 239 lb 12.8 oz (108.8 kg)  12/15/21 240 lb (108.9 kg)    GEN: Well nourished, well developed in no acute distress NECK: No JVD; No carotid bruits CARDIAC: ***RRR, no murmurs, rubs, gallops RESPIRATORY:  Clear to auscultation without rales, wheezing or rhonchi  ABDOMEN: Soft, non-tender, non-distended EXTREMITIES:  No edema; No deformity  ASSESSMENT AND PLAN: .   Assessment and Plan                 {Are you ordering a CV Procedure (e.g. stress test, cath, DCCV, TEE, etc)?   Press F2        :[id number]}   Follow-up: No follow-ups on file.  Signed, Darryle DASEN. Barbaraann, MD, [facility]  [facility]  [address] [place], [place] [address] [phone]  1:55 PM   "

## 2024-10-23 ENCOUNTER — Ambulatory Visit: Attending: Cardiology | Admitting: Cardiovascular Disease

## 2024-10-25 DIAGNOSIS — G4733 Obstructive sleep apnea (adult) (pediatric): Secondary | ICD-10-CM | POA: Diagnosis not present

## 2024-11-12 ENCOUNTER — Ambulatory Visit

## 2024-11-12 VITALS — BP 146/85 | HR 79

## 2024-11-12 DIAGNOSIS — I10 Essential (primary) hypertension: Secondary | ICD-10-CM

## 2024-11-12 NOTE — Patient Instructions (Addendum)
 Changes made by your pharmacist Isiaah Cuervo E. Bradie Lacock, PharmD, CPP at today's visit:    Instructions/Changes  (what do you need to do) Your Notes  (what you did and when you did it)  Continue spironolactone  50mg  daily, diltiazem  240 mg daily, chlorthalidone  25mg  daily, and carvedilol  6.25 mg twice daily   2. Take blood pressure ~2 hours after blood      pressure meds   3. Try to take blood pressure twice daily for     a couple days and monitor when your    blood pressure is elevate and stagger     medications   4. PharmD appointment 12/24/24 at 10:45         AM    Bring all of your meds, your BP cuff and your record of home blood pressures to your next appointment.   Deen Deguia E. Redford Behrle, Pharm.D, CPP [place] [facility]. [facility] [address], [place], [place] [address] Phone: [phone]; Fax: [phone]     HOW TO TAKE YOUR BLOOD PRESSURE AT HOME  Rest 5 minutes before taking your blood pressure. Dont smoke or drink caffeinated beverages for at least 30 minutes before. Take your blood pressure before (not after) you eat. Sit comfortably with your back supported and both feet on the floor (dont cross your legs). Elevate your arm to heart level on a table or a desk. Use the proper sized cuff. It should fit smoothly and snugly around your bare upper arm. There should be enough room to slip a fingertip under the cuff. The bottom edge of the cuff should be 1 inch above the crease of the elbow. Ideally, take 3 measurements at one sitting and record the average.  Important lifestyle changes to control high blood pressure  Intervention  Effect on the BP  Lose extra pounds and watch your waistline Weight loss is one of the most effective lifestyle changes for controlling blood pressure. If you're overweight or obese, losing even a small amount of weight can help reduce blood pressure. Blood pressure might go down by about 1 millimeter of mercury (mm  Hg) with each kilogram (about 2.2 pounds) of weight lost.  Exercise regularly As a general goal, aim for at least 30 minutes of moderate physical activity every day. Regular physical activity can lower high blood pressure by about 5 to 8 mm Hg.  Eat a healthy diet Eating a diet rich in whole grains, fruits, vegetables, and low-fat dairy products and low in saturated fat and cholesterol. A healthy diet can lower high blood pressure by up to 11 mm Hg.  Reduce salt (sodium) in your diet Even a small reduction of sodium in the diet can improve heart health and reduce high blood pressure by about 5 to 6 mm Hg.  Limit alcohol One drink equals 12 ounces of beer, 5 ounces of wine, or 1.5 ounces of 80-proof liquor.  Limiting alcohol to less than one drink a day for women or two drinks a day for men can help lower blood pressure by about 4 mm Hg.   If you have any questions or concerns please use My Chart to send questions or call the office at [phone]

## 2024-11-12 NOTE — Assessment & Plan Note (Addendum)
 Assessment: BP is uncontrolled in office BP 146/85 mmHg above the goal (<130/80). Denies SOB, palpitation, chest pain, headaches Home BP averaging 147/93 HR 66; confirmed home BP monitor accurate Tolerates carvedilol , diltiazem , spironolactone  and chlorthalidone  well without any side effects Currently on maximized dose of spironolactone  and chlorthalidone  Discussed with patient that other first line agents (ACEi/ARB/dihydropyridine CCBs) are not suitable due to prior ADR to ARBs/CCBs Reiterated the importance of regular exercise and low salt diet  Reinforced the importance of medication adherence and discussed the risks associated with uncontrolled blood pressure. Explained that the patients heart rate is averaging at the lower end of normal, which could potentially limit further titration of the beta-blocker. Noted that other medications, such as hydralazine, require multiple daily doses, which may not be ideal given the current nonadherence.  Plan:  Will not make any changes today given non adherence: Continue carvedilol  6.25 mg twice daily, spironolactone  50 mg daily, diltiazem  240 mg daily, and chlorthalidone  25 mg daily Patient instructed to keep a record of blood pressure readings along with heart rate and bring the log to the next visit. Advised to check blood pressure twice daily for a few days and, if possible, stagger medication doses instead of taking all in the morning. Then patient may return to once-daily monitoring. Patient to see PharmD in 4-5 weeks for follow up and call if any questions/concerns before next appointment Upcoming appointment with Dr. Burton 11/27/2024; instructed patient to bring BP log and discuss GLP-1 therapy

## 2024-11-12 NOTE — Progress Notes (Signed)
 " Patient ID: Matthew Navarro                 DOB: [birth year]                      MRN: [id number]     HPI: Matthew Navarro is a 49 y.o. male referred by Katlyn West to HTN clinic. 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 Previously on olmesartan-amlodipine back in 2016; developed tongue swelling after several months of taking therapy, diagnosed as angioedema, medication discontinued.  11/12/2024 (Today):  Patient presents today for PharmD HTN f/u appointment. He was last seen by PharmD in November 2025, where it found patient was only taking carvedilol  once daily. Instructed patient to take twice daily and continue all other meds as prescribed at that visit. Today, he states since our last appointment he's taken carvedilol  twice daily only 50% of the time. He forgets to take the second dose and has set a reminder on his phone. He reports adherence to spironolactone  50 mg daily, diltiazem  240 mg daily, and chlorthalidone  25 mg daily and taking as prescribed. He notes that his diltiazem  dose is now 240 mg and managed by PCP. He has been checking his blood pressure regularly and brought a log today showing average readings of 147/93 mmHg with a heart rate of 66 bpm. He denies any SOB, chest pain or headache.   Patient is nonadherent to the second dose of carvedilol . He reports using an alarm as a reminder, which has been helpful, and plans to keep it with him at work to avoid forgetting. He typically takes spironolactone  50 mg daily, diltiazem  300 mg daily, chlorthalidone  25 mg daily, and the first dose of carvedilol  6.25 mg in the morning. He checks his blood pressure later in the afternoon, around 3 PM.  He presents with his new blood pressure cuff (Omron brand). Cuff was found to be accurate after comparing to office BP monitor.  He is still deciding up GLP-1 initiation. Informed patient that I can assess coverage and GLP-1 management; He will think about  some more and notify me if he becomes interested.   Home BP readings 08/2024 - 10/2024  146/99 60 144/94 65 145/87 67 142/88  70 141/90  67 140/92 71 144/89  67 146/99 60 138/88 73 159/99 73 164/97  65 150/103 61 154/97 69 170/99 67 146/91 66 158/97 70 137/92 63 138/87 68 146/91 62 146/91 62  Average: 147/93 HR 66  09/24/2024 The patient presents today for a PharmD hypertension follow-up visit. He was last seen by PharmD in October 2025, where carvedilol  was initiated due to uncontrolled blood pressure on his HTN regimen. He reports adherence to spironolactone  50 mg daily, diltiazem  300 mg daily, and chlorthalidone  25 mg daily and taking as prescribed. He notes that his diltiazem  dose is now 300 mg, previously reported as 240 mg at the last PharmD visit, which patient states was titrated by his PCP. He was advised to bring his medication bottles to the next visit for verification. The patient has been taking carvedilol  once daily instead of twice daily, as prescribed, stating he was unaware and assumed all his medications were once daily. He has been checking his blood pressure regularly and brought a log today showing average readings of 148/92 mmHg with a heart rate of 68 bpm. He denies any SOB, chest pain or headache.  He presents with his new blood pressure cuff (non Omron  brand) obtained through his insurance to have validated. Cuff was found to be inaccurate after comparing to office BP monitor, SBP and DBP read > 10 points higher than office BP. We discussed more accurate BP monitor brands, and patient agreeable to pick up Omron BP cuff from pharmacy downstairs.  Exercise: He is very active. Works out 4x a week.   Eats salad every day. Wife cooks vegetables nightly along with chicken or ground turkey. Does not add salt to food.  Current HTN meds: spironolactone  50mg  daily, diltiazem  240 mg daily, chlorthalidone  25mg  daily, and carvedilol  6.25 mg twice daily  CMP  (07/30/24): Na 134, K 3.6, Cl 95, Scr 1.05, Crcl: 108 ml/min  BP goal: <130/80  Wt Readings from Last 3 Encounters:  05/02/24 243 lb 9.6 oz (110.5 kg)  12/27/22 239 lb 12.8 oz (108.8 kg)  12/15/21 240 lb (108.9 kg)   BP Readings from Last 3 Encounters:  11/12/24 (!) 146/85  09/24/24 (!) 135/92  08/08/24 (!) 168/111   Pulse Readings from Last 3 Encounters:  11/12/24 79  09/24/24 95  08/08/24 79    Renal function: CrCl cannot be calculated (Patient's most recent lab result is older than the maximum 21 days allowed.).  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 90 tablet 3   Blood Pressure Monitoring (OMRON 3 SERIES BP MONITOR) DEVI Use to monitor blood pressure as directed. 1 each 0   carvedilol  (COREG ) 6.25 MG tablet TAKE 1 TABLET BY MOUTH TWICE A DAY 60 tablet 1   chlorthalidone  (HYGROTON ) 25 MG tablet Take 1 tablet (25 mg total) by mouth daily. 90 tablet 2   diltiazem  (CARDIZEM  CD) 300 MG 24 hr capsule Take 300 mg by mouth daily.     doxycycline  (VIBRAMYCIN ) 100 MG capsule Take 1 capsule (100 mg total) by mouth 2 (two) times daily. 20 capsule 0   EPINEPHrine  0.3 mg/0.3 mL IJ SOAJ injection Inject 0.3 mLs (0.3 mg total) into the muscle once. 4 Device 3   Multiple Vitamin (MULTIVITAMIN) capsule Take 1 capsule by mouth daily.     spironolactone  (ALDACTONE ) 50 MG tablet      No current facility-administered medications on file prior to visit.    Allergies  Allergen Reactions   Amlodipine     Tongue swelling   Amlodipine-Olmesartan     Tongue swelling   Pantoprazole Swelling    Assessment: BP is uncontrolled in office BP 146/85 mmHg above the goal  (<130/80). Denies SOB, palpitation, chest pain, headaches Home BP averaging 147/93 HR 66; confirmed home BP monitor accurate Tolerates carvedilol , diltiazem , spironolactone  and chlorthalidone  well without any side effects Currently on maximized dose of spironolactone  and chlorthalidone  Discussed with patient that other first line agents (ACEi/ARB/dihydropyridine CCBs) are not suitable due to prior ADR to ARBs/CCBs Reiterated the importance of regular exercise and low salt diet  Reinforced the importance of medication adherence and discussed the risks associated with uncontrolled blood pressure. Explained that the patients heart rate is averaging at the lower end of normal, which could potentially limit further titration of the beta-blocker. Noted that other medications, such as hydralazine, require multiple  daily doses, which may not be ideal given the current nonadherence.  Plan:  Will not make any changes today given non adherence: Continue carvedilol  6.25 mg twice daily, spironolactone  50 mg daily, diltiazem  240 mg daily, and chlorthalidone  25 mg daily Patient instructed to keep a record of blood pressure readings along with heart rate and bring the log to the next visit. Advised to check blood pressure twice daily for a few days and, if possible, stagger medication doses instead of taking all in the morning. Then patient may return to once-daily monitoring. Patient to see PharmD in 4-5 weeks for follow up and call if any questions/concerns before next appointment Upcoming appointment with Dr. Burton 11/27/2024; instructed patient to bring BP log and discuss GLP-1 therapy    Jeannine Pennisi E. Novaleigh Kohlman, Pharm.D [place] [facility]. [facility] [address], [place], [place] [address] Phone: [phone]; Fax: [phone]    "

## 2024-11-13 ENCOUNTER — Encounter: Payer: Self-pay | Admitting: Pharmacist

## 2024-11-14 ENCOUNTER — Telehealth: Payer: Self-pay

## 2024-11-14 ENCOUNTER — Telehealth: Payer: Self-pay | Admitting: Pharmacy Technician

## 2024-11-14 ENCOUNTER — Other Ambulatory Visit (HOSPITAL_COMMUNITY): Payer: Self-pay

## 2024-11-14 NOTE — Telephone Encounter (Signed)
 Pharmacy Patient Advocate Encounter  Received notification from [employer] that Prior Authorization for wegovy has been APPROVED from 11/14/24 to 05/14/25. Ran test claim, Copay is $49.99. This test claim was processed through [facility]- copay amounts may vary at other pharmacies due to pharmacy/plan contracts, or as the patient moves through the different stages of their insurance plan.   PA #/Case ID/Reference #: [id number]

## 2024-11-14 NOTE — Telephone Encounter (Signed)
 Please see other encounter.

## 2024-11-14 NOTE — Telephone Encounter (Signed)
 Pharmacy Patient Advocate Encounter   Received notification from [employer] that prior authorization for OZEMPIC is required/requested.   Insurance verification completed.   The patient is insured through [facility].   Per test claim: PA required; PA submitted to above mentioned insurance via Latent Key/confirmation #/EOC [id number] Status is pending

## 2024-11-22 NOTE — Progress Notes (Signed)
 " Cardiology Office Note:  .   Date:  11/27/2024  ID:  Matthew Navarro, DOB January 07, 1976, MRN [id number] PCP: Venancio Glenis RIGGERS  [facility] Providers Cardiologist:  Darryle ONEIDA Decent, MD   History of Present Illness: .    Chief Complaint  Patient presents with   Follow-up         Matthew Navarro is a 49 y.o. male with below history who presents for follow-up.   History of Present Illness   Matthew Navarro is a 49 year old male with non-obstructive coronary vascular disease and hypertension who presents for a follow-up visit.  He has a history of non-obstructive coronary vascular disease, with a CT scan in 2022 showing no blockages but some calcium  deposits. He has no history of myocardial infarction or stroke. He exercises regularly, about four times a week, including weight training and 30 minutes of cardio, without limitations. No chest pain or trouble breathing. He is currently on Lipitor 40 mg, with his last cholesterol check in August. He is due for another check next week and is concerned about his triglycerides.  His hypertension is managed with carvedilol  6.25 mg twice a day, diltiazem  300 mg daily, and spironolactone  50 mg daily. His blood pressure fluctuates, and he is working with a pharmacy team to optimize his medication regimen.  He has hypothyroidism.  Family history is significant for cardiovascular disease; his father died of a heart attack, and his paternal grandfather had a stroke. He does not smoke, uses alcohol occasionally, and denies drug use. He works as an scientist, water quality for [employer], is married, and has no children.           Problem List CAD -CAC 18.6 (86th percentile) -mild disease <50% 2. HLD -T chol 186, HDL 44, LDL 92, TG 303 3. HTN    ROS: All other ROS reviewed and negative. Pertinent positives noted in the HPI.     Studies Reviewed: SABRA       CCTA 12/29/2020 IMPRESSION: 1.  Nonobstructive CAD.    2. Coronary artery calcium  score 18.6 Agatston units. This places the patient in the 86th percentile for age and gender.  Physical Exam:   VS:  BP 128/82   Pulse 72   Ht 5' 10 (1.778 m)   Wt 250 lb (113.4 kg)   SpO2 94%   BMI 35.87 kg/m    Wt Readings from Last 3 Encounters:  11/27/24 250 lb (113.4 kg)  05/02/24 243 lb 9.6 oz (110.5 kg)  12/27/22 239 lb 12.8 oz (108.8 kg)    GEN: Well nourished, well developed in no acute distress NECK: No JVD; No carotid bruits CARDIAC: RRR, no murmurs, rubs, gallops RESPIRATORY:  Clear to auscultation without rales, wheezing or rhonchi  ABDOMEN: Soft, non-tender, non-distended EXTREMITIES:  No edema; No deformity  ASSESSMENT AND PLAN: .   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 No aspirin needed. Focus on prevention of disease progression. - Ordered LPA test for genetic risk assessment. - Continue current blood pressure medication. - Encouraged weight loss.  Hyperlipidemia with elevated triglycerides LDL 92, not at goal. Elevated triglycerides. On Lipitor 40 mg. Plan to start GLP-1 agonist for weight loss and lipid improvement. - Start GLP-1 agonist (Ozempic). - Forward lipid profile results from next week's blood work. - Ordered LPA test with lipid profile.  Primary hypertension Well-controlled with carvedilol , diltiazem , spironolactone . Fluctuations noted.  Working with pharmacy team. - Continue current antihypertensive regimen. - Continue working with pharmacy team.  Obesity (BMI 36) BMI 36. Weight fluctuating. Plan to start GLP-1 agonist for weight loss to improve cardiovascular health. - Start GLP-1 agonist (Ozempic). - Encouraged continued exercise regimen.                Follow-up: Return in about 1 year (around 11/27/2025).  Signed, Darryle DASEN. Barbaraann, MD, [facility]  [facility]  [address] [place], [place]  [address] [phone]  3:30 PM   "

## 2024-11-27 ENCOUNTER — Ambulatory Visit: Attending: Cardiovascular Disease | Admitting: Cardiovascular Disease

## 2024-11-27 ENCOUNTER — Encounter: Payer: Self-pay | Admitting: Cardiovascular Disease

## 2024-11-27 VITALS — BP 128/82 | HR 72 | Ht 70.0 in | Wt 250.0 lb

## 2024-11-27 DIAGNOSIS — E785 Hyperlipidemia, unspecified: Secondary | ICD-10-CM

## 2024-11-27 DIAGNOSIS — E669 Obesity, unspecified: Secondary | ICD-10-CM | POA: Diagnosis not present

## 2024-11-27 DIAGNOSIS — I2510 Atherosclerotic heart disease of native coronary artery without angina pectoris: Secondary | ICD-10-CM

## 2024-11-27 DIAGNOSIS — I10 Essential (primary) hypertension: Secondary | ICD-10-CM | POA: Diagnosis not present

## 2024-11-27 NOTE — Patient Instructions (Addendum)
 Medication Instructions:  Your physician recommends that you continue on your current medications as directed. Please refer to the Current Medication list given to you today.  *If you need a refill on your cardiac medications before your next appointment, please call your pharmacy*  Lab Work: Lp(a). Please have your lab results forwarded to our office.  If you have labs (blood work) drawn today and your tests are completely normal, you will receive your results only by: MyChart Message (if you have MyChart) OR A paper copy in the mail If you have any lab test that is abnormal or we need to change your treatment, we will call you to review the results.  Follow-Up: At [facility], you and your health needs are our priority.  As part of our continuing mission to provide you with exceptional heart care, our providers are all part of one team.  This team includes your primary Cardiologist (physician) and Advanced Practice Providers or APPs (Physician Assistants and Nurse Practitioners) who all work together to provide you with the care you need, when you need it.  Your next appointment:   1 year(s)  Provider:   APP

## 2024-11-29 ENCOUNTER — Ambulatory Visit: Payer: Self-pay | Admitting: Cardiovascular Disease

## 2024-11-29 LAB — LIPOPROTEIN A (LPA): Lipoprotein (a): <8.4 nmol/L

## 2024-12-06 ENCOUNTER — Encounter: Payer: Self-pay | Admitting: Cardiovascular Disease

## 2024-12-24 ENCOUNTER — Ambulatory Visit
